# Patient Record
Sex: Female | Born: 1985
Health system: Southern US, Community
[De-identification: ages and names within clinical notes are randomized; demographics above are authoritative.]

## PROBLEM LIST (undated history)

## (undated) DIAGNOSIS — J45909 Unspecified asthma, uncomplicated: Secondary | ICD-10-CM

## (undated) DIAGNOSIS — D649 Anemia, unspecified: Secondary | ICD-10-CM

## (undated) DIAGNOSIS — F419 Anxiety disorder, unspecified: Secondary | ICD-10-CM

---

## 2011-08-26 LAB — HM PAP SMEAR

## 2015-04-26 LAB — HM PAP SMEAR

## 2016-07-10 ENCOUNTER — Encounter: Payer: Self-pay | Admitting: General Practice

## 2016-10-13 ENCOUNTER — Ambulatory Visit (INDEPENDENT_AMBULATORY_CARE_PROVIDER_SITE_OTHER): Payer: PRIVATE HEALTH INSURANCE | Admitting: Family Medicine

## 2016-10-13 ENCOUNTER — Encounter: Payer: Self-pay | Admitting: Family Medicine

## 2016-10-13 VITALS — BP 110/80 | HR 73 | Temp 98.0°F | Resp 16 | Ht 69.0 in | Wt 146.1 lb

## 2016-10-13 DIAGNOSIS — Z0000 Encounter for general adult medical examination without abnormal findings: Secondary | ICD-10-CM | POA: Insufficient documentation

## 2016-10-13 DIAGNOSIS — Z23 Encounter for immunization: Secondary | ICD-10-CM

## 2016-10-13 DIAGNOSIS — Z01419 Encounter for gynecological examination (general) (routine) without abnormal findings: Secondary | ICD-10-CM | POA: Diagnosis not present

## 2016-10-13 DIAGNOSIS — R200 Anesthesia of skin: Secondary | ICD-10-CM | POA: Insufficient documentation

## 2016-10-13 DIAGNOSIS — T85698A Other mechanical complication of other specified internal prosthetic devices, implants and grafts, initial encounter: Secondary | ICD-10-CM | POA: Diagnosis not present

## 2016-10-13 LAB — HEPATIC FUNCTION PANEL
ALBUMIN: 4.5 g/dL (ref 3.5–5.2)
ALK PHOS: 40 U/L (ref 39–117)
ALT: 14 U/L (ref 0–35)
AST: 15 U/L (ref 0–37)
BILIRUBIN TOTAL: 0.4 mg/dL (ref 0.2–1.2)
Bilirubin, Direct: 0.1 mg/dL (ref 0.0–0.3)
Total Protein: 7 g/dL (ref 6.0–8.3)

## 2016-10-13 LAB — CBC WITH DIFFERENTIAL/PLATELET
Basophils Absolute: 0 10*3/uL (ref 0.0–0.1)
Basophils Relative: 0.8 % (ref 0.0–3.0)
EOS PCT: 2 % (ref 0.0–5.0)
Eosinophils Absolute: 0.1 10*3/uL (ref 0.0–0.7)
HCT: 35.8 % — ABNORMAL LOW (ref 36.0–46.0)
Hemoglobin: 12 g/dL (ref 12.0–15.0)
LYMPHS ABS: 1.7 10*3/uL (ref 0.7–4.0)
Lymphocytes Relative: 34.1 % (ref 12.0–46.0)
MCHC: 33.6 g/dL (ref 30.0–36.0)
MCV: 89.4 fl (ref 78.0–100.0)
MONO ABS: 0.7 10*3/uL (ref 0.1–1.0)
MONOS PCT: 13.1 % — AB (ref 3.0–12.0)
NEUTROS ABS: 2.5 10*3/uL (ref 1.4–7.7)
NEUTROS PCT: 50 % (ref 43.0–77.0)
PLATELETS: 264 10*3/uL (ref 150.0–400.0)
RBC: 4 Mil/uL (ref 3.87–5.11)
RDW: 15.1 % (ref 11.5–15.5)
WBC: 5.1 10*3/uL (ref 4.0–10.5)

## 2016-10-13 LAB — BASIC METABOLIC PANEL
BUN: 9 mg/dL (ref 6–23)
CHLORIDE: 107 meq/L (ref 96–112)
CO2: 27 meq/L (ref 19–32)
CREATININE: 0.69 mg/dL (ref 0.40–1.20)
Calcium: 9.3 mg/dL (ref 8.4–10.5)
GFR: 104.17 mL/min (ref >60.00)
Glucose, Bld: 97 mg/dL (ref 70–99)
POTASSIUM: 4 meq/L (ref 3.5–5.1)
Sodium: 140 mEq/L (ref 135–145)

## 2016-10-13 LAB — LIPID PANEL
CHOL/HDL RATIO: 3
Cholesterol: 177 mg/dL (ref 0–200)
HDL: 63.6 mg/dL (ref >39.00)
LDL Cholesterol: 105 mg/dL — ABNORMAL HIGH (ref 0–99)
NonHDL: 113.66
TRIGLYCERIDES: 44 mg/dL (ref 0.0–149.0)
VLDL: 8.8 mg/dL (ref 0.0–40.0)

## 2016-10-13 LAB — TSH: TSH: 1.02 u[IU]/mL (ref 0.35–4.50)

## 2016-10-13 LAB — VITAMIN D 25 HYDROXY (VIT D DEFICIENCY, FRACTURES): VITD: 17.9 ng/mL — AB (ref 30.00–100.00)

## 2016-10-13 NOTE — Progress Notes (Signed)
Pre visit review using our clinic review tool, if applicable. No additional management support is needed unless otherwise documented below in the visit note. 

## 2016-10-13 NOTE — Progress Notes (Signed)
   Subjective:    Patient ID: Sherry Rodriguez, female    DOB: 11/27/1985, 31 y.o.   MRN: [id number]  HPI New to establish.  No recent PCP.    CPE- no current concerns.  UTD on pap, too young mammo, colonoscopy.   Review of Systems Patient reports no vision/ hearing changes, adenopathy,fever, weight change,  persistant/recurrent hoarseness , swallowing issues, chest pain, palpitations, edema, persistant/recurrent cough, hemoptysis, dyspnea (rest/exertional/paroxysmal nocturnal), gastrointestinal bleeding (melena, rectal bleeding), abdominal pain, significant heartburn, bowel changes, GU symptoms (dysuria, hematuria, incontinence), Gyn symptoms (abnormal  bleeding, pain),  syncope, focal weakness, memory loss, numbness & tingling, skin/hair/nail changes, abnormal bruising or bleeding, anxiety, or depression.     Objective:   Physical Exam General Appearance:    Alert, cooperative, no distress, appears stated age  Head:    Normocephalic, without obvious abnormality, atraumatic  Eyes:    PERRL, conjunctiva/corneas clear, EOM's intact, fundi    benign, both eyes  Ears:    Normal TM's and external ear canals, both ears  Nose:   Nares normal, septum midline, mucosa normal, no drainage    or sinus tenderness  Throat:   Lips, mucosa, and tongue normal; teeth and gums normal  Neck:   Supple, symmetrical, trachea midline, no adenopathy;    Thyroid: no enlargement/tenderness/nodules  Back:     Symmetric, no curvature, ROM normal, no CVA tenderness  Lungs:     Clear to auscultation bilaterally, respirations unlabored  Chest Wall:    No tenderness or deformity   Heart:    Regular rate and rhythm, S1 and S2 normal, no murmur, rub   or gallop  Breast Exam:    Deferred to GYN  Abdomen:     Soft, non-tender, bowel sounds active all four quadrants,    no masses, no organomegaly  Genitalia:    Deferred to GYN  Rectal:    Extremities:   Extremities normal, atraumatic, no cyanosis or edema  Pulses:   2+  and symmetric all extremities  Skin:   Skin color, texture, turgor normal, no rashes or lesions  Lymph nodes:   Cervical, supraclavicular, and axillary nodes normal  Neurologic:   CNII-XII intact, normal strength, diminished sensation on L side of face,normal reflexes throughout          Assessment & Plan:

## 2016-10-13 NOTE — Patient Instructions (Signed)
Follow up in 1 year or as needed We'll notify you of your lab results and make any changes if needed We'll call you with your GYN referral and your Oral-Maxillofacial surgery appt Continue to work on healthy diet and regular exercise- you look great!!! Call with any questions or concerns Happy [employer] [place] [employer]uch[employer]irthday!!!

## 2016-10-13 NOTE — Addendum Note (Signed)
Addended by: Geannie RisenBRODMERKEL, JESSICA L on: 10/13/2016 02:25 PM   Modules accepted: Orders

## 2016-10-14 NOTE — Progress Notes (Signed)
Called pt and lmovm to return call.

## 2016-10-15 ENCOUNTER — Other Ambulatory Visit: Payer: Self-pay | Admitting: General Practice

## 2016-10-15 MED ORDER — VITAMIN D (ERGOCALCIFEROL) 1.25 MG (50000 UNIT) PO CAPS: 50000.0000 [IU] | ORAL_CAPSULE | ORAL | 0 refills | Status: DC

## 2016-10-27 LAB — HM PAP SMEAR

## 2016-11-04 ENCOUNTER — Encounter: Payer: Self-pay | Admitting: General Practice

## 2016-12-19 ENCOUNTER — Ambulatory Visit (INDEPENDENT_AMBULATORY_CARE_PROVIDER_SITE_OTHER): Payer: PRIVATE HEALTH INSURANCE | Admitting: Physician Assistant

## 2016-12-19 ENCOUNTER — Encounter: Payer: Self-pay | Admitting: Physician Assistant

## 2016-12-19 VITALS — BP 104/60 | HR 55 | Temp 97.6°F | Resp 14 | Ht 69.0 in | Wt 144.0 lb

## 2016-12-19 DIAGNOSIS — H1031 Unspecified acute conjunctivitis, right eye: Secondary | ICD-10-CM | POA: Insufficient documentation

## 2016-12-19 MED ORDER — POLYMYXIN B-TRIMETHOPRIM 10000-0.1 UNIT/ML-% OP SOLN: 1.0000 [drp] | Freq: Four times a day (QID) | OPHTHALMIC | 0 refills | Status: DC

## 2016-12-19 NOTE — Progress Notes (Signed)
Pre visit review using our clinic review tool, if applicable. No additional management support is needed unless otherwise documented below in the visit note. 

## 2016-12-19 NOTE — Progress Notes (Signed)
Patient presents to clinic today c/o redness of R eye x 1 day. Notes some irritation to eye last night when her son accidentally poked her in her eye. Notes initial discomfort that resolved. Now with eye redness. Denies crusting, increased tearing or drainage. Denies vision changes. Denies fever, chills, malaise.  History reviewed. No pertinent past medical history.  Current Outpatient Prescriptions on File Prior to Visit  Medication Sig Dispense Refill  . Vitamin D, Ergocalciferol, (DRISDOL) 50000 units CAPS capsule Take 1 capsule (50,000 Units total) by mouth every 7 (seven) days. 12 capsule 0   No current facility-administered medications on file prior to visit.     Allergies  Allergen Reactions  . Tamiflu [Oseltamivir Phosphate] Other (See Comments)    Caused sleepwalking    Family History  Problem Relation Age of Onset  . Healthy Mother   . Healthy Father   . Heart failure Paternal Grandmother   . Diabetes Paternal Grandmother     Social History   Social History  . Marital status: Married    Spouse name: N/A  . Number of children: N/A  . Years of education: N/A   Social History Main Topics  . Smoking status: Never Smoker  . Smokeless tobacco: Never Used  . Alcohol use Yes  . Drug use: Yes    Types: Marijuana  . Sexual activity: Not Asked   Other Topics Concern  . None   Social History Narrative  . None    Review of Systems - See HPI.  All other ROS are negative.  BP 104/60   Pulse (!) 55   Temp 97.6 F (36.4 C) (Oral)   Resp 14   Ht  (1.753 m)   Wt 144 lb (65.3 kg)   SpO2 99%   BMI 21.27 kg/m   Physical Exam  Constitutional: She is oriented to person, place, and time and well-developed, well-nourished, and in no distress.  HENT:  Head: Normocephalic and atraumatic.  Eyes: EOM are normal. Pupils are equal, round, and reactive to light. Right eye exhibits no discharge, no exudate and no hordeolum. No foreign body present in the right eye.  Left eye exhibits no discharge, no exudate and no hordeolum. No foreign body present in the left eye. Right conjunctiva is injected. Right conjunctiva has no hemorrhage. Left conjunctiva is not injected. Left conjunctiva has no hemorrhage.  Neck: Neck supple.  Cardiovascular: Normal rate, regular rhythm, normal heart sounds and intact distal pulses.   Pulmonary/Chest: Effort normal and breath sounds normal. No respiratory distress. She has no wheezes. She has no rales. She exhibits no tenderness.  Lymphadenopathy:    She has no cervical adenopathy.  Neurological: She is alert and oriented to person, place, and time.  Skin: Skin is warm and dry. No rash noted.  Psychiatric: Affect normal.  Vitals reviewed.  Recent Results (from the past 2160 hour(s))  Lipid panel     Status: Abnormal   Collection Time: 10/13/16  2:22 PM  Result Value Ref Range   Cholesterol 177 0 - 200 mg/dL    Comment: ATP III Classification       Desirable:  < 200 mg/dL               Borderline High:  200 - 239 mg/dL          High:  > = 409 mg/dL   Triglycerides 81.1 0.0 - 149.0 mg/dL    Comment: Normal:  <914 mg/dLBorderline High:  150 - 199  mg/dL   HDL 16.10 >96.04 mg/dL   VLDL 8.8 0.0 - 54.0 mg/dL   LDL Cholesterol 981 (H) 0 - 99 mg/dL   Total CHOL/HDL Ratio 3     Comment:                Men          Women1/2 Average Risk     3.4          3.3Average Risk          5.0          4.42X Average Risk          9.6          7.13X Average Risk          15.0          11.0                       NonHDL 113.66     Comment: NOTE:  Non-HDL goal should be 30 mg/dL higher than patient's LDL goal (i.e. LDL goal of < 70 mg/dL, would have non-HDL goal of < 100 mg/dL)  Basic metabolic panel     Status: None   Collection Time: 10/13/16  2:22 PM  Result Value Ref Range   Sodium 140 135 - 145 mEq/L   Potassium 4.0 3.5 - 5.1 mEq/L   Chloride 107 96 - 112 mEq/L   CO2 27 19 - 32 mEq/L   Glucose, Bld 97 70 - 99 mg/dL   BUN 9 6 - 23 mg/dL     Creatinine, Ser 1.91 0.40 - 1.20 mg/dL   Calcium 9.3 8.4 - 47.8 mg/dL   GFR 295.62 >13.08 mL/min  TSH     Status: None   Collection Time: 10/13/16  2:22 PM  Result Value Ref Range   TSH 1.02 0.35 - 4.50 uIU/mL  Hepatic function panel     Status: None   Collection Time: 10/13/16  2:22 PM  Result Value Ref Range   Total Bilirubin 0.4 0.2 - 1.2 mg/dL   Bilirubin, Direct 0.1 0.0 - 0.3 mg/dL   Alkaline Phosphatase 40 39 - 117 U/L   AST 15 0 - 37 U/L   ALT 14 0 - 35 U/L   Total Protein 7.0 6.0 - 8.3 g/dL   Albumin 4.5 3.5 - 5.2 g/dL  VITAMIN D 25 Hydroxy (Vit-D Deficiency, Fractures)     Status: Abnormal   Collection Time: 10/13/16  2:22 PM  Result Value Ref Range   VITD 17.90 (L) 30.00 - 100.00 ng/mL  CBC with Differential/Platelet     Status: Abnormal   Collection Time: 10/13/16  2:22 PM  Result Value Ref Range   WBC 5.1 4.0 - 10.5 K/uL   RBC 4.00 3.87 - 5.11 Mil/uL   Hemoglobin 12.0 12.0 - 15.0 g/dL   HCT 65.7 (L) 84.6 - 96.2 %   MCV 89.4 78.0 - 100.0 fl   MCHC 33.6 30.0 - 36.0 g/dL   RDW 95.2 84.1 - 32.4 %   Platelets 264.0 150.0 - 400.0 K/uL   Neutrophils Relative % 50.0 43.0 - 77.0 %   Lymphocytes Relative 34.1 12.0 - 46.0 %   Monocytes Relative 13.1 (H) 3.0 - 12.0 %   Eosinophils Relative 2.0 0.0 - 5.0 %   Basophils Relative 0.8 0.0 - 3.0 %   Neutro Abs 2.5 1.4 - 7.7 K/uL   Lymphs Abs 1.7 0.7 - 4.0 K/uL  Monocytes Absolute 0.7 0.1 - 1.0 K/uL   Eosinophils Absolute 0.1 0.0 - 0.7 K/uL   Basophils Absolute 0.0 0.0 - 0.1 K/uL  HM PAP SMEAR     Status: None   Collection Time: 10/27/16 12:00 AM  Result Value Ref Range   HM Pap smear completed    Assessment/Plan: Acute conjunctivitis of right eye Irritant versus start of bacterial. Supportive measures and OTC medications reviewed. ABX given for patient to use as directed if symptoms are not improving overnight.     Piedad Climes, PA-C

## 2016-12-19 NOTE — Assessment & Plan Note (Signed)
Irritant versus start of bacterial. Supportive measures and OTC medications reviewed. ABX given for patient to use as directed if symptoms are not improving overnight.

## 2016-12-19 NOTE — Patient Instructions (Signed)
Please avoid touching the eye.  Can use warm compresses to promote any drainage. Also to help soothe the eye.  Again this can be just from irritation. If not improving overnight, please start the antibiotic I have given you and take as directed. Follow-up if not resolving.

## 2017-01-05 ENCOUNTER — Other Ambulatory Visit: Payer: Self-pay | Admitting: Family Medicine

## 2017-03-02 ENCOUNTER — Encounter: Payer: Self-pay | Admitting: Physician Assistant

## 2017-03-02 ENCOUNTER — Ambulatory Visit (INDEPENDENT_AMBULATORY_CARE_PROVIDER_SITE_OTHER): Payer: PRIVATE HEALTH INSURANCE | Admitting: Physician Assistant

## 2017-03-02 ENCOUNTER — Other Ambulatory Visit (HOSPITAL_COMMUNITY)
Admission: RE | Admit: 2017-03-02 | Discharge: 2017-03-02 | Disposition: A | Discharge status: Home / Self Care | Payer: PRIVATE HEALTH INSURANCE | Source: Ambulatory Visit | Attending: Physician Assistant | Admitting: Physician Assistant

## 2017-03-02 VITALS — BP 108/68 | HR 52 | Temp 98.2°F | Resp 14 | Ht 69.0 in | Wt 146.0 lb

## 2017-03-02 DIAGNOSIS — R8290 Unspecified abnormal findings in urine: Secondary | ICD-10-CM

## 2017-03-02 LAB — POCT URINALYSIS DIPSTICK
BILIRUBIN UA: NEGATIVE
Glucose, UA: NEGATIVE
Ketones, UA: NEGATIVE
LEUKOCYTES UA: NEGATIVE
NITRITE UA: NEGATIVE
PH UA: 6 (ref 5.0–8.0)
PROTEIN UA: NEGATIVE
Spec Grav, UA: 1.02 (ref 1.010–1.025)
UROBILINOGEN UA: 0.2 U/dL

## 2017-03-02 NOTE — Addendum Note (Signed)
Addended by: Con MemosMOORE, Kennedie Pardoe S on: 03/02/2017 01:34 PM   Modules accepted: Orders

## 2017-03-02 NOTE — Patient Instructions (Signed)
Please stay well-hydrated. Start a daily probiotic. We are doing further testing on your urine. I will call with results and we will alter regimen based on results.   Follow-up if new or worsening symptoms occur.

## 2017-03-02 NOTE — Progress Notes (Signed)
   Patient presents to clinic today x 3 weeks of foul-smelling urine. Denies dysuria, low back pain or suprapubic pain. Denies urinary frequency, urgency. Denies fever, chills, nausea or vomiting. Denies vaginal symptoms. Is currently on menstrual period. Does endorses significant past history of UTI.   History reviewed. No pertinent past medical history.  No current outpatient prescriptions on file prior to visit.   No current facility-administered medications on file prior to visit.     Allergies  Allergen Reactions  . Tamiflu [Oseltamivir Phosphate] Other (See Comments)    Caused sleepwalking    Family History  Problem Relation Age of Onset  . Healthy Mother   . Healthy Father   . Heart failure Paternal Grandmother   . Diabetes Paternal Grandmother     Social History   Social History  . Marital status: Married    Spouse name: N/A  . Number of children: N/A  . Years of education: N/A   Social History Main Topics  . Smoking status: Never Smoker  . Smokeless tobacco: Never Used  . Alcohol use Yes  . Drug use: Yes    Types: Marijuana  . Sexual activity: Not Asked   Other Topics Concern  . None   Social History Narrative  . None   Review of Systems - See HPI.  All other ROS are negative.  BP 108/68   Pulse (!) 52   Temp 98.2 F (36.8 C) (Oral)   Resp 14   Ht 5\' 9"  (1.753 m)   Wt 146 lb (66.2 kg)   SpO2 99%   BMI 21.56 kg/m   Physical Exam  Constitutional: She is oriented to person, place, and time and well-developed, well-nourished, and in no distress.  HENT:  Head: Normocephalic and atraumatic.  Eyes: Conjunctivae are normal.  Cardiovascular: Normal rate, regular rhythm, normal heart sounds and intact distal pulses.   Pulmonary/Chest: Effort normal and breath sounds normal. No respiratory distress. She has no wheezes. She has no rales. She exhibits no tenderness.  Abdominal: Soft. Bowel sounds are normal. She exhibits no distension and no mass. There  is no tenderness. There is no rebound and no guarding.  Negative CVA tenderness.  Neurological: She is alert and oriented to person, place, and time.  Skin: Skin is warm and dry. No rash noted.  Psychiatric: Affect normal.  Vitals reviewed.   Assessment/Plan: 1. Abnormal urine odor Urine dip + blood - patient on menstrual period currently. Urine dip negative for sign on infection. Will send for culture and urine ancillary testing for BV, yeast, etc. Start probiotic daily. Continue hydration. Will treat based on lab results.  - POCT Urinalysis Dipstick   Piedad ClimesMartin, Malikiah Debarr Cody, PA-C

## 2017-03-02 NOTE — Progress Notes (Signed)
Pre visit review using our clinic review tool, if applicable. No additional management support is needed unless otherwise documented below in the visit note. 

## 2017-03-04 ENCOUNTER — Other Ambulatory Visit: Payer: Self-pay | Admitting: Physician Assistant

## 2017-03-04 DIAGNOSIS — N390 Urinary tract infection, site not specified: Secondary | ICD-10-CM

## 2017-03-04 LAB — URINE CULTURE

## 2017-03-04 MED ORDER — CEPHALEXIN 500 MG PO CAPS: 500.0000 mg | ORAL_CAPSULE | Freq: Two times a day (BID) | ORAL | 0 refills | Status: AC

## 2017-03-05 LAB — URINE CYTOLOGY ANCILLARY ONLY
Bacterial vaginitis: NEGATIVE
Candida vaginitis: NEGATIVE

## 2017-06-09 ENCOUNTER — Encounter: Payer: Self-pay | Admitting: Physician Assistant

## 2017-06-09 ENCOUNTER — Ambulatory Visit (INDEPENDENT_AMBULATORY_CARE_PROVIDER_SITE_OTHER): Payer: 59 | Admitting: Physician Assistant

## 2017-06-09 VITALS — BP 102/70 | HR 62 | Temp 98.4°F | Resp 14 | Ht 69.0 in | Wt 144.0 lb

## 2017-06-09 DIAGNOSIS — R399 Unspecified symptoms and signs involving the genitourinary system: Secondary | ICD-10-CM | POA: Diagnosis not present

## 2017-06-09 LAB — POCT URINALYSIS DIPSTICK
BILIRUBIN UA: NEGATIVE
Glucose, UA: NEGATIVE
Ketones, UA: NEGATIVE
NITRITE UA: NEGATIVE
PH UA: 7.5 (ref 5.0–8.0)
Spec Grav, UA: <=1.005 — AB (ref 1.010–1.025)
UROBILINOGEN UA: 0.2 U/dL

## 2017-06-09 LAB — URINALYSIS, MICROSCOPIC ONLY

## 2017-06-09 MED ORDER — CIPROFLOXACIN HCL 500 MG PO TABS: 500.0000 mg | ORAL_TABLET | Freq: Two times a day (BID) | ORAL | 0 refills | Status: DC

## 2017-06-09 NOTE — Progress Notes (Signed)
Pre visit review using our clinic review tool, if applicable. No additional management support is needed unless otherwise documented below in the visit note. 

## 2017-06-09 NOTE — Progress Notes (Signed)
Patient presents to clinic today c/o 3 days of urinary urgency, dysuria. Patient with history of UTI stating they feel similar. Has noted significant blood in the urine. Denies fever, chills, vomiting. Endorses some lower abdominal cramping. 6 more days. Denies vaginal pain, itch or discharge. Denies dyspareunia.  Has tried AZO Sunday with little relief. Has been hydrating well.    History reviewed. No pertinent past medical history.  No current outpatient prescriptions on file prior to visit.   No current facility-administered medications on file prior to visit.     Allergies  Allergen Reactions  . Tamiflu [Oseltamivir Phosphate] Other (See Comments)    Caused sleepwalking    Family History  Problem Relation Age of Onset  . Healthy Mother   . Healthy Father   . Heart failure Paternal Grandmother   . Diabetes Paternal Grandmother     Social History   Social History  . Marital status: Married    Spouse name: N/A  . Number of children: N/A  . Years of education: N/A   Social History Main Topics  . Smoking status: Never Smoker  . Smokeless tobacco: Never Used  . Alcohol use Yes  . Drug use: Yes    Types: Marijuana  . Sexual activity: Not Asked   Other Topics Concern  . None   Social History Narrative  . None   Review of Systems - See HPI.  All other ROS are negative.  BP 102/70   Pulse 62   Temp 98.4 F (36.9 C) (Oral)   Resp 14   Ht  (1.753 m)   Wt 144 lb (65.3 kg)   SpO2 98%   BMI 21.27 kg/m   Physical Exam  Constitutional: She is oriented to person, place, and time and well-developed, well-nourished, and in no distress.  HENT:  Head: Normocephalic and atraumatic.  Eyes: Conjunctivae are normal.  Neck: Neck supple.  Cardiovascular: Normal rate, regular rhythm, normal heart sounds and intact distal pulses.   Pulmonary/Chest: Effort normal and breath sounds normal. No respiratory distress. She has no wheezes. She has no rales. She exhibits no  tenderness.  Abdominal: Soft. Bowel sounds are normal. She exhibits no distension and no mass. There is no tenderness. There is no rebound, no guarding and no CVA tenderness.  Neurological: She is alert and oriented to person, place, and time.  Skin: Skin is warm and dry.  Vitals reviewed.  Recent Results (from the past 2160 hour(s))  POCT Urinalysis Dipstick     Status: Abnormal   Collection Time: 06/09/17  9:12 AM  Result Value Ref Range   Color, UA amber    Clarity, UA cloudy    Glucose, UA negative    Bilirubin, UA negative    Ketones, UA negative    Spec Grav, UA <=1.005 (A) 1.010 - 1.025   Blood, UA 3+    pH, UA 7.5 5.0 - 8.0   Protein, UA 1+    Urobilinogen, UA 0.2 0.2 or 1.0 E.U./dL   Nitrite, UA negative    Leukocytes, UA Large (3+) (A) Negative    Assessment/Plan: 1. UTI symptoms Urine dip + LE and blood. No alarm signs/symptoms. Husband with vasectomy. Due for menstrual period in the next couple of days. Denues any concern for pregnancy. + history of UTI. Start Cipro. Will check culture and urine microalbumin. Supportive measures reviewed. - POCT Urinalysis Dipstick - Urine Culture - ciprofloxacin (CIPRO) 500 MG tablet; Take 1 tablet (500 mg total) by mouth 2 (  two) times daily.  Dispense: 10 tablet; Refill: 0   Piedad Climes, [place]

## 2017-06-09 NOTE — Patient Instructions (Signed)
Your symptoms are consistent with a bladder infection, also called acute cystitis. Please take your antibiotic (Cipro) as directed until all pills are gone.  Stay very well hydrated.  Consider a daily probiotic (Align, Culturelle, or Activia) to help prevent stomach upset caused by the antibiotic.  Taking a probiotic daily may also help prevent recurrent UTIs.  Also consider taking AZO (Phenazopyridine) tablets to help decrease pain with urination.  I will call you with your urine testing results.  We will change antibiotics if indicated.  Call or return to clinic if symptoms are not resolved by completion of antibiotic.   Urinary Tract Infection A urinary tract infection (UTI) can occur any place along the urinary tract. The tract includes the kidneys, ureters, bladder, and urethra. A type of germ called bacteria often causes a UTI. UTIs are often helped with antibiotic medicine.  HOME CARE   If given, take antibiotics as told by your doctor. Finish them even if you start to feel better.  Drink enough fluids to keep your pee (urine) clear or pale yellow.  Avoid tea, drinks with caffeine, and bubbly (carbonated) drinks.  Pee often. Avoid holding your pee in for a long time.  Pee before and after having sex (intercourse).  Wipe from front to back after you poop (bowel movement) if you are a woman. Use each tissue only once. GET HELP RIGHT AWAY IF:   You have back pain.  You have lower belly (abdominal) pain.  You have chills.  You feel sick to your stomach (nauseous).  You throw up (vomit).  Your burning or discomfort with peeing does not go away.  You have a fever.  Your symptoms are not better in 3 days. MAKE SURE YOU:   Understand these instructions.  Will watch your condition.  Will get help right away if you are not doing well or get worse. Document Released: 01/28/2008 Document Revised: 05/05/2012 Document Reviewed: 03/11/2012 Exit[employer] Patient Information 2015  [employer]. This information is not intended to replace advice given to you by your health care provider. Make sure you discuss any questions you have with your health care provider.    

## 2017-06-11 LAB — URINE CULTURE
MICRO NUMBER: 81157443
SPECIMEN QUALITY: ADEQUATE

## 2017-10-06 ENCOUNTER — Encounter: Payer: Self-pay | Admitting: Physician Assistant

## 2017-10-06 ENCOUNTER — Other Ambulatory Visit: Payer: Self-pay

## 2017-10-06 ENCOUNTER — Ambulatory Visit (INDEPENDENT_AMBULATORY_CARE_PROVIDER_SITE_OTHER): Payer: 59 | Admitting: Physician Assistant

## 2017-10-06 VITALS — BP 118/78 | HR 51 | Temp 97.8°F | Resp 14 | Ht 69.0 in | Wt 147.0 lb

## 2017-10-06 DIAGNOSIS — R0602 Shortness of breath: Secondary | ICD-10-CM | POA: Diagnosis not present

## 2017-10-06 MED ORDER — ALBUTEROL SULFATE HFA 108 (90 BASE) MCG/ACT IN AERS
2.0000 | INHALATION_SPRAY | Freq: Four times a day (QID) | RESPIRATORY_TRACT | 0 refills | Status: DC | PRN
Start: 2017-10-06 — End: 2017-11-02

## 2017-10-06 NOTE — Progress Notes (Signed)
   Patient presents to clinic today c/o noted SOB with significant exertion. Is noting while exercising. Denies any chest pain, racing heart, lightheadedness or dizziness. Notes history of reactive airway when exercising as a child. Was never diagnosed or treated. Has recently gotten into [employer] and noticing these symptoms. Denies chronic cough. Denies family history of HOCM or sudden syncope.   History reviewed. No pertinent past medical history.  No current outpatient medications on file prior to visit.   No current facility-administered medications on file prior to visit.     Allergies  Allergen Reactions  . Tamiflu [Oseltamivir Phosphate] Other (See Comments)    Caused sleepwalking    Family History  Problem Relation Age of Onset  . Healthy Mother   . Healthy Father   . Heart failure Paternal Grandmother   . Diabetes Paternal Grandmother     Social History   Socioeconomic History  . Marital status: Married    Spouse name: None  . Number of children: None  . Years of education: None  . Highest education level: None  Social Needs  . Financial resource strain: None  . Food insecurity - worry: None  . Food insecurity - inability: None  . Transportation needs - medical: None  . Transportation needs - non-medical: None  Occupational History  . None  Tobacco Use  . Smoking status: Never Smoker  . Smokeless tobacco: Never Used  Substance and Sexual Activity  . Alcohol use: Yes  . Drug use: Yes    Types: Marijuana  . Sexual activity: None  Other Topics Concern  . None  Social History Narrative  . None   Review of Systems - See HPI.  All other ROS are negative.  BP 118/78   Pulse (!) 51   Temp 97.8 F (36.6 C) (Oral)   Resp 14   Ht 5\' 9"  (1.753 m)   Wt 147 lb (66.7 kg)   SpO2 98%   BMI 21.71 kg/m   Physical Exam  Constitutional: She is oriented to person, place, and time and well-developed, well-nourished, and in no distress.  HENT:  Head:  Normocephalic and atraumatic.  Eyes: Conjunctivae are normal.  Neck: Neck supple.  Cardiovascular: Normal rate, regular rhythm, normal heart sounds and intact distal pulses.  Pulmonary/Chest: Effort normal and breath sounds normal. No respiratory distress. She has no wheezes. She has no rales. She exhibits no tenderness.  Neurological: She is alert and oriented to person, place, and time.  Skin: Skin is warm and dry. No rash noted.  Psychiatric: Affect normal.  Vitals reviewed.  Assessment/Plan: 1. SOBOE (shortness of breath on exertion) Exam unremarkable. Suspect exercise-induced. Will give SABA to use prior to training/exercise to see if this helps. Referral to Pulmonology placed for PFTs.  - albuterol (PROVENTIL HFA;VENTOLIN HFA) 108 (90 Base) MCG/ACT inhaler; Inhale 2 puffs into the lungs every 6 (six) hours as needed for wheezing or shortness of breath.  Dispense: 1 Inhaler; Refill: 0 - Ambulatory referral to Pulmonology   Piedad ClimesWilliam Cody Arnelle Nale, PA-C

## 2017-10-06 NOTE — Patient Instructions (Signed)
I am setting you up for lung function testing.  Use the albuterol 30 minutes prior to exercise to see how this helps.    How to Use a Metered Dose Inhaler A metered dose inhaler is a handheld device for taking medicine that must be breathed into the lungs (inhaled). The device can be used to deliver a variety of inhaled medicines, including:  Quick relief or rescue medicines, such as bronchodilators.  Controller medicines, such as corticosteroids.  The medicine is delivered by pushing down on a metal canister to release a preset amount of spray and medicine. Each device contains the amount of medicine that is needed for a preset number of uses (inhalations). Your health care provider may recommend that you use a spacer with your inhaler to help you take the medicine more effectively. A spacer is a plastic tube with a mouthpiece on one end and an opening that connects to the inhaler on the other end. A spacer holds the medicine in a tube for a short time, which allows you to inhale more medicine. What are the risks? If you do not use your inhaler correctly, medicine might not reach your lungs to help you breathe. Inhaler medicine can cause side effects, such as:  Mouth or throat infection.  Cough.  Hoarseness.  Headache.  Nausea and vomiting.  Lung infection (pneumonia) in people who have a lung condition called COPD.  How to use a metered dose inhaler without a spacer 1. Remove the cap from the inhaler. 2. If you are using the inhaler for the first time, shake it for 5 seconds, turn it away from your face, then release 4 puffs into the air. This is called priming. 3. Shake the inhaler for 5 seconds. 4. Position the inhaler so the top of the canister faces up. 5. Put your index finger on the top of the medicine canister. Support the bottom of the inhaler with your thumb. 6. Breathe out normally and as completely as possible, away from the inhaler. 7. Either place the inhaler  between your teeth and close your lips tightly around the mouthpiece, or hold the inhaler 1-2 inches (2.5-5 cm) away from your open mouth. Keep your tongue down out of the way. If you are unsure which technique to use, ask your health care provider. 8. Press the canister down with your index finger to release the medicine, then inhale deeply and slowly through your mouth (not your nose) until your lungs are completely filled. Inhaling should take 4-6 seconds. 9. Hold the medicine in your lungs for 5-10 seconds (10 seconds is best). This helps the medicine get into the small airways of your lungs. 10. With your lips in a tight circle (pursed), breathe out slowly. 11. Repeat steps 3-10 until you have taken the number of puffs that your health care provider directed. Wait about 1 minute between puffs or as directed. 12. Put the cap on the inhaler. 13. If you are using a steroid inhaler, rinse your mouth with water, gargle, and spit out the water. Do not swallow the water. How to use a metered dose inhaler with a spacer 1. Remove the cap from the inhaler. 2. If you are using the inhaler for the first time, shake it for 5 seconds, turn it away from your face, then release 4 puffs into the air. This is called priming. 3. Shake the inhaler for 5 seconds. 4. Place the open end of the spacer onto the inhaler mouthpiece. 5. Position the inhaler  so the top of the canister faces up and the spacer mouthpiece faces you. 6. Put your index finger on the top of the medicine canister. Support the bottom of the inhaler and the spacer with your thumb. 7. Breathe out normally and as completely as possible, away from the spacer. 8. Place the spacer between your teeth and close your lips tightly around it. Keep your tongue down out of the way. 9. Press the canister down with your index finger to release the medicine, then inhale deeply and slowly through your mouth (not your nose) until your lungs are completely filled.  Inhaling should take 4-6 seconds. 10. Hold the medicine in your lungs for 5-10 seconds (10 seconds is best). This helps the medicine get into the small airways of your lungs. 11. With your lips in a tight circle (pursed), breathe out slowly. 12. Repeat steps 3-11 until you have taken the number of puffs that your health care provider directed. Wait about 1 minute between puffs or as directed. 13. Remove the spacer from the inhaler and put the cap on the inhaler. 14. If you are using a steroid inhaler, rinse your mouth with water, gargle, and spit out the water. Do not swallow the water. Follow these instructions at home:  Take your inhaled medicine only as told by your health care provider. Do not use the inhaler more than directed by your health care provider.  Keep all follow-up visits as told by your health care provider. This is important.  If your inhaler has a counter, you can check it to determine how full your inhaler is. If your inhaler does not have a counter, ask your health care provider when you will need to refill your inhaler and write the refill date on a calendar or on your inhaler canister. Note that you cannot know when an inhaler is empty by shaking it.  Follow directions on the package insert for care and cleaning of your inhaler and spacer. Contact a health care provider if:  Symptoms are only partially relieved with your inhaler.  You are having trouble using your inhaler.  You have an increase in phlegm.  You have headaches. Get help right away if:  You feel little or no relief after using your inhaler.  You have dizziness.  You have a fast heart rate.  You have chills or a fever.  You have night sweats.  There is blood in your phlegm. Summary  A metered dose inhaler is a handheld device for taking medicine that must be breathed into the lungs (inhaled).  The medicine is delivered by pushing down on a metal canister to release a preset amount of spray  and medicine.  Each device contains the amount of medicine that is needed for a preset number of uses (inhalations). This information is not intended to replace advice given to you by your health care provider. Make sure you discuss any questions you have with your health care provider. Document Released: 08/11/2005 Document Revised: 07/01/2016 Document Reviewed: 07/01/2016 Elsevier Interactive Patient Education  2017 [employer].

## 2017-10-14 ENCOUNTER — Encounter: Payer: PRIVATE HEALTH INSURANCE | Admitting: Family Medicine

## 2017-11-02 ENCOUNTER — Other Ambulatory Visit: Payer: Self-pay | Admitting: Physician Assistant

## 2017-11-02 DIAGNOSIS — R0602 Shortness of breath: Secondary | ICD-10-CM

## 2018-01-25 ENCOUNTER — Institutional Professional Consult (permissible substitution): Payer: 59 | Admitting: Internal Medicine

## 2019-09-12 ENCOUNTER — Ambulatory Visit (INDEPENDENT_AMBULATORY_CARE_PROVIDER_SITE_OTHER): Payer: 59 | Admitting: Physician Assistant

## 2019-09-12 ENCOUNTER — Other Ambulatory Visit (HOSPITAL_COMMUNITY)
Admission: RE | Admit: 2019-09-12 | Discharge: 2019-09-12 | Disposition: A | Discharge status: Home / Self Care | Payer: 59 | Source: Ambulatory Visit | Attending: Physician Assistant | Admitting: Physician Assistant

## 2019-09-12 ENCOUNTER — Other Ambulatory Visit: Payer: Self-pay

## 2019-09-12 ENCOUNTER — Encounter: Payer: Self-pay | Admitting: Physician Assistant

## 2019-09-12 DIAGNOSIS — R8290 Unspecified abnormal findings in urine: Secondary | ICD-10-CM

## 2019-09-12 DIAGNOSIS — F411 Generalized anxiety disorder: Secondary | ICD-10-CM | POA: Diagnosis not present

## 2019-09-12 MED ORDER — CITALOPRAM HYDROBROMIDE 10 MG PO TABS: 10.0000 mg | ORAL_TABLET | Freq: Every day | ORAL | 1 refills | Status: DC

## 2019-09-12 MED ORDER — HYDROXYZINE HCL 25 MG PO TABS
25.0000 mg | ORAL_TABLET | Freq: Three times a day (TID) | ORAL | 0 refills | Status: DC | PRN
Start: 2019-09-12 — End: 2019-09-15

## 2019-09-12 NOTE — Progress Notes (Signed)
Virtual Visit via Video   I connected with patient on 09/12/19 at  2:30 PM EST by a video enabled telemedicine application and verified that I am speaking with the correct person using two identifiers.  Location patient: Home Location provider: [place] [facility], Office Persons participating in the virtual visit: Patient, Provider, CMA Wyonia Hough)  I discussed the limitations of evaluation and management by telemedicine and the availability of in person appointments. The patient expressed understanding and agreed to proceed.  Subjective:   HPI:   Patient presents via doxy.need today to discuss symptoms of potential anxiety.  Patient endorses increase in worrying associated with some depressed mood and anhedonia over the past couple months, worsening after the death of her dog..  Notes symptoms are worse early in the morning when she wakes up.  Notes she feels shaky/jittery with sweaty palms and chest tightness.  Also notes significant increase in bowel frequency in the morning with some abdominal cramping.  Symptoms improved throughout the day, especially abdominal symptoms.  Does feel anxious and down in the dumps most days of the week.  Denies suicidal thought or ideation.  In relation to GI symptoms patient denies melena, hematochezia or tenesmus.  Denies nausea or vomiting.  Denies heartburn.  Denies change in weight.  Patient does note some difficulty with sleep onset.  Denies prior history of anxiety.  Notes she is just had a lot of stressors recently.  Is currently seeing a marriage Veterinary surgeon.  Patient also endorses foul odor to her menstrual.  This past month.  States.  Was a couple days early, otherwise remarkable.  Denies any ongoing symptoms.  Denies urinary urgency, frequency or dysuria.  Denies vaginal pressure, vaginal pain or discharge.  Is wondering if she can have her urine checked for reassurance.  ROS:   See pertinent positives and negatives per HPI.  Patient  Active Problem List   Diagnosis Date Noted  . Physical exam 10/13/2016  . Left facial numbness 10/13/2016  . 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 Smoking status: Never Smoker  . Smokeless tobacco: Never Used  Substance Use Topics  . Alcohol use: Yes    Current Outpatient Medications:  .  albuterol (PROVENTIL HFA;VENTOLIN HFA) 108 (90 Base) MCG/ACT inhaler, TAKE 2 PUFFS BY MOUTH EVERY 6 HOURS AS NEEDED FOR WHEEZE OR SHORTNESS OF BREATH (Patient not taking: Reported on 09/12/2019), Disp: 18 g, Rfl: 3  Allergies  Allergen Reactions  . Tamiflu [Oseltamivir Phosphate] Other (See Comments)    Caused sleepwalking    Objective:   There were no vitals taken for this visit.  Patient is well-developed, well-nourished in no acute distress.  Resting comfortably at home.  Head is normocephalic, atraumatic.  No labored breathing.  Speech is clear and coherent with logical content.  Patient is alert and oriented at baseline.   Assessment and Plan:   1. Anxiety state Suspect generalized anxiety with more episodes of acute anxiety that are situational.  Some associated depressed mood and anhedonia.  Treatment options discussed with patient.  She does have the headspace app on her phone and will continue utilizing this to help with anxiety.  Mindfulness workbook is also recommended.  Patient is currently seeing a marriage counselor but will consider seeing a personal counselor as well.  Will start hydroxyzine 25 mg 3 times daily as needed for more acute anxiety.  Wanting to avoid benzodiazepines.  Also start citalopram 10 mg  once daily.  Follow-up in 3 to 4 weeks for reassessment.  She can follow-up with myself or her PCP. - hydrOXYzine (ATARAX/VISTARIL) 25 MG tablet; Take 1 tablet (25 mg total) by mouth 3 (three) times daily as needed.  Dispense: 30 tablet; Refill: 0 - citalopram (CELEXA) 10 MG tablet; Take 1 tablet (10 mg total) by  mouth daily.  Dispense: 30 tablet; Refill: 1  2. Urine/Menstrual abnormality Will obtain UA and urine ancillary testing for BV and yeast.  Patient without ongoing symptoms.  Hopefully negative testing will offer reassurance.  Will treat any abnormal findings. - Urinalysis, Routine w reflex microscopic - Urine cytology ancillary only([place]) .   Leeanne Rio, PA-C 09/12/2019

## 2019-09-13 LAB — URINALYSIS, ROUTINE W REFLEX MICROSCOPIC
Bilirubin Urine: NEGATIVE
Ketones, ur: 15 — AB
Leukocytes,Ua: NEGATIVE
Nitrite: NEGATIVE
Specific Gravity, Urine: 1.02 (ref 1.000–1.030)
Total Protein, Urine: NEGATIVE
Urine Glucose: NEGATIVE
Urobilinogen, UA: 0.2 (ref 0.0–1.0)
pH: 6 (ref 5.0–8.0)

## 2019-09-14 ENCOUNTER — Telehealth: Payer: Self-pay | Admitting: Family Medicine

## 2019-09-14 NOTE — Telephone Encounter (Signed)
Please advise of dosage of Hydroxyzine

## 2019-09-14 NOTE — Telephone Encounter (Signed)
Pt concerned that the Atarax is not working. Taking 1 does not ease the anxiety and taking 2 makes her feel over medicated without helping the anxiety.

## 2019-09-15 ENCOUNTER — Telehealth: Payer: Self-pay

## 2019-09-15 MED ORDER — ALPRAZOLAM 0.25 MG PO TABS: 0.2500 mg | ORAL_TABLET | Freq: Two times a day (BID) | ORAL | 0 refills | Status: DC | PRN

## 2019-09-15 NOTE — Telephone Encounter (Signed)
Duplicate phone notes for patient today. Xanax sent to pharmacy to replace Hydroxyzine. See other note for further details.

## 2019-09-15 NOTE — Telephone Encounter (Signed)
We may need to switch from the hydroxyzine to a low dose Xanax 0.25 mg for more acute anxiety. Please see if she is willing and I will send in Rx. Is she taking the SSRI daily as directed? This will take a few weeks to fully get into her system.

## 2019-09-15 NOTE — Telephone Encounter (Signed)
Patient also states she has started the Citalopram. She is aware the Xanax is as needed.

## 2019-09-15 NOTE — Telephone Encounter (Signed)
Patient called in stating that she reviewed her urinalysis results and is concerned with the level of ketones in her urine. Has been researching online and all of her symptoms correlate with hypoglycemia. Wants to know what Cody's thoughts are on this. Please advise

## 2019-09-15 NOTE — Telephone Encounter (Signed)
I think reasonable to check a BMP to see where glucose levels lie. Would like to check repeat UA with her well-hydrated and non-fasting to see if there is any residual ketonuria. I do think that if there is some hypoglycemia present there is definitely still a significant amount of anxiety and depressed mood that we should continue to treat.   Can we get her in this week or Monday AM for labs/repeat UA?

## 2019-09-15 NOTE — Telephone Encounter (Signed)
Left a detailed message on patient VM of Sherry Rodriguez, recommendations. When patient call back needs to know if she is agreeable with switching the Hydroxyzine to Xanax and we will send to the pharmacy for her. Has she started the Citalopram daily? This medication will take a few weeks to fully get into her system.

## 2019-09-15 NOTE — Telephone Encounter (Signed)
Pt agree to start Xanax

## 2019-09-15 NOTE — Progress Notes (Signed)
See phone note.   I know she had completed her visible menstrual period but likely residual microscopic blood and mucous from that. Addressed via phone note.

## 2019-09-15 NOTE — Telephone Encounter (Signed)
Patient has been scheduled for labs tomorrow at 1:30 PM

## 2019-09-15 NOTE — Addendum Note (Signed)
Addended by: Waldon Merl on: 09/15/2019 01:23 PM   Modules accepted: Orders

## 2019-09-16 ENCOUNTER — Other Ambulatory Visit (INDEPENDENT_AMBULATORY_CARE_PROVIDER_SITE_OTHER): Payer: 59

## 2019-09-16 ENCOUNTER — Encounter: Payer: Self-pay | Admitting: Physician Assistant

## 2019-09-16 ENCOUNTER — Ambulatory Visit (INDEPENDENT_AMBULATORY_CARE_PROVIDER_SITE_OTHER): Payer: 59

## 2019-09-16 ENCOUNTER — Other Ambulatory Visit: Payer: Self-pay

## 2019-09-16 ENCOUNTER — Other Ambulatory Visit: Payer: Self-pay | Admitting: Physician Assistant

## 2019-09-16 DIAGNOSIS — R824 Acetonuria: Secondary | ICD-10-CM

## 2019-09-16 DIAGNOSIS — R7309 Other abnormal glucose: Secondary | ICD-10-CM

## 2019-09-16 LAB — URINALYSIS, ROUTINE W REFLEX MICROSCOPIC
Bilirubin Urine: NEGATIVE
Hgb urine dipstick: NEGATIVE
Ketones, ur: NEGATIVE
Leukocytes,Ua: NEGATIVE
Nitrite: NEGATIVE
RBC / HPF: NONE SEEN (ref 0–?)
Specific Gravity, Urine: 1.02 (ref 1.000–1.030)
Total Protein, Urine: NEGATIVE
Urine Glucose: NEGATIVE
Urobilinogen, UA: 0.2 (ref 0.0–1.0)
WBC, UA: NONE SEEN (ref 0–?)
pH: 6.5 (ref 5.0–8.0)

## 2019-09-16 LAB — BASIC METABOLIC PANEL
BUN: 7 mg/dL (ref 6–23)
CO2: 24 mEq/L (ref 19–32)
Calcium: 10.6 mg/dL — ABNORMAL HIGH (ref 8.4–10.5)
Chloride: 103 mEq/L (ref 96–112)
Creatinine, Ser: 0.77 mg/dL (ref 0.40–1.20)
GFR: 84.87 mL/min (ref >60.00)
Glucose, Bld: 131 mg/dL — ABNORMAL HIGH (ref 70–99)
Potassium: 3.6 mEq/L (ref 3.5–5.1)
Sodium: 142 mEq/L (ref 135–145)

## 2019-09-16 LAB — HEMOGLOBIN A1C: Hgb A1c MFr Bld: 5.7 % (ref 4.6–6.5)

## 2019-09-19 ENCOUNTER — Other Ambulatory Visit: Payer: Self-pay

## 2019-09-19 LAB — URINE CYTOLOGY ANCILLARY ONLY
Bacterial Vaginitis-Urine: POSITIVE — AB
Candida Urine: NEGATIVE

## 2019-09-19 MED ORDER — METRONIDAZOLE 500 MG PO TABS: 500.0000 mg | ORAL_TABLET | Freq: Three times a day (TID) | ORAL | 0 refills | Status: DC

## 2019-09-19 MED ORDER — METRONIDAZOLE 500 MG PO TABS: ORAL_TABLET | ORAL | 0 refills | Status: DC

## 2019-10-04 ENCOUNTER — Other Ambulatory Visit: Payer: Self-pay

## 2019-10-04 ENCOUNTER — Ambulatory Visit (INDEPENDENT_AMBULATORY_CARE_PROVIDER_SITE_OTHER): Payer: 59 | Admitting: Family Medicine

## 2019-10-04 ENCOUNTER — Encounter: Payer: Self-pay | Admitting: Family Medicine

## 2019-10-04 VITALS — HR 75

## 2019-10-04 DIAGNOSIS — F411 Generalized anxiety disorder: Secondary | ICD-10-CM

## 2019-10-04 MED ORDER — CITALOPRAM HYDROBROMIDE 20 MG PO TABS: 20.0000 mg | ORAL_TABLET | Freq: Every day | ORAL | 3 refills | Status: DC

## 2019-10-04 NOTE — Progress Notes (Signed)
I have discussed the procedure for the virtual visit with the patient who has given consent to proceed with assessment and treatment.   Pt unable to obtain vitals.   Davidlee Jeanbaptiste L Kirkland Figg, CMA     

## 2019-10-04 NOTE — Progress Notes (Signed)
   Virtual Visit via Video   I connected with patient on 10/04/19 at 11:30 AM EST by a video enabled telemedicine application and verified that I am speaking with the correct person using two identifiers.  Location patient: Home Location provider: [facility], Office Persons participating in the virtual visit: Patient, Provider, CMA (Jess B)  I discussed the limitations of evaluation and management by telemedicine and the availability of in person appointments. The patient expressed understanding and agreed to proceed.  Subjective:   HPI:   Anxiety- pt saw Marcelline Mates PA-C on 1/18 and was started on Citalopram.  Pt reports 'i'm feeling better than I was a few weeks ago but still not great'.  Pt reports continued tearfulness, feeling overwhelmed, sadness.  Lost her dog in September and rescued a new dog which has worsened the anxiety.  'untrained 73 month old puppy'.  Sleep is hit or miss.  Anxiety is worse in AM- 'even before I'm awake'.  Pt is also in process of opening business.    ROS:   See pertinent positives and negatives per HPI.  Patient Active Problem List   Diagnosis Date Noted  . Physical exam 10/13/2016  . Left facial numbness 10/13/2016  . 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 Smoking status: Never Smoker  . Smokeless tobacco: Never Used  Substance Use Topics  . Alcohol use: Yes    Current Outpatient Medications:  .  ALPRAZolam (XANAX) 0.25 MG tablet, Take 1 tablet (0.25 mg total) by mouth 2 (two) times daily as needed for anxiety., Disp: 20 tablet, Rfl: 0 .  citalopram (CELEXA) 10 MG tablet, Take 1 tablet (10 mg total) by mouth daily., Disp: 30 tablet, Rfl: 1  Allergies  Allergen Reactions  . Tamiflu [Oseltamivir Phosphate] Other (See Comments)    Caused sleepwalking    Objective:   Pulse 75   AAOx3, NAD NCAT, EOMI No obvious CN deficits Coloring WNL Pt is able to speak  clearly, coherently without shortness of breath or increased work of breathing.  Thought process is linear.  Mood is appropriate.   Assessment and Plan:   Anxiety- pt reports sxs are improving but there is still room to go.  Feels most of her anxiety is related to taking on a new puppy while grieving the loss of her old dog.  Finds the training very overwhelming.  Will increase Citalopram to 20mg  daily and monitor for improvement.  She states diarrhea is better, but will have increased belching in the AM.  Suspect this is due to increased acid production from stress.  She will start OTC acid reducer and monitor for sx improvement.  Pt expressed understanding and is in agreement w/ plan.    , MD 10/04/2019

## 2019-10-28 ENCOUNTER — Ambulatory Visit (INDEPENDENT_AMBULATORY_CARE_PROVIDER_SITE_OTHER): Payer: 59 | Admitting: Family Medicine

## 2019-10-28 ENCOUNTER — Encounter: Payer: Self-pay | Admitting: Family Medicine

## 2019-10-28 ENCOUNTER — Other Ambulatory Visit: Payer: Self-pay

## 2019-10-28 ENCOUNTER — Other Ambulatory Visit: Payer: Self-pay | Admitting: Family Medicine

## 2019-10-28 VITALS — HR 82 | Ht 69.0 in | Wt 140.0 lb

## 2019-10-28 DIAGNOSIS — F411 Generalized anxiety disorder: Secondary | ICD-10-CM

## 2019-10-28 NOTE — Progress Notes (Signed)
   Virtual Visit via Video   I connected with patient on 10/28/19 at 10:30 AM EST by a video enabled telemedicine application and verified that I am speaking with the correct person using two identifiers.  Location patient: Home Location provider: [facility], Office Persons participating in the virtual visit: Patient, Provider, CMA (Jess B)  I discussed the limitations of evaluation and management by telemedicine and the availability of in person appointments. The patient expressed understanding and agreed to proceed.  Subjective:   HPI:   Anxiety- at last visit Citalopram was increased to 20mg  daily.  PHQ improved from 10 --> 3.  'I'm feeling a whole lot better'.  Wants to continue medication at this time but feels 'it was just an episode'.  Pt is very happy w/ improved mood.  ROS:   See pertinent positives and negatives per HPI.  Patient Active Problem List   Diagnosis Date Noted  . Physical exam 10/13/2016  . Left facial numbness 10/13/2016  . 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 Smoking status: Never Smoker  . Smokeless tobacco: Never Used  Substance Use Topics  . Alcohol use: Yes    Current Outpatient Medications:  .  ALPRAZolam (XANAX) 0.25 MG tablet, Take 1 tablet (0.25 mg total) by mouth 2 (two) times daily as needed for anxiety., Disp: 20 tablet, Rfl: 0 .  citalopram (CELEXA) 20 MG tablet, TAKE 1 TABLET BY MOUTH EVERY DAY, Disp: 90 tablet, Rfl: 2  Allergies  Allergen Reactions  . Tamiflu [Oseltamivir Phosphate] Other (See Comments)    Caused sleepwalking    Objective:   Pulse 82   Ht 5\' 9"  (1.753 m)   Wt 140 lb (63.5 kg)   BMI 20.67 kg/m   AAOx3, NAD NCAT, EOMI No obvious CN deficits Coloring WNL Pt is able to speak clearly, coherently without shortness of breath or increased work of breathing.  Thought process is linear.  Mood is appropriate.   Assessment and Plan:    Anxiety- much improved since increasing Citalopram to 20mg  daily.  Not needing to use Alprazolam as frequently.  Feels much more 'like myself'.  Will continue medication at this time and reassess in the future.  Pt expressed understanding and is in agreement w/ plan.    10/15/2016, MD 10/28/2019

## 2019-10-28 NOTE — Progress Notes (Signed)
I have discussed the procedure for the virtual visit with the patient who has given consent to proceed with assessment and treatment.   Yuvan Medinger L Latwan Luchsinger, CMA     

## 2019-11-29 ENCOUNTER — Encounter: Payer: Self-pay | Admitting: Family Medicine

## 2019-12-20 ENCOUNTER — Encounter: Payer: Self-pay | Admitting: Family Medicine

## 2019-12-20 DIAGNOSIS — F411 Generalized anxiety disorder: Secondary | ICD-10-CM

## 2019-12-21 MED ORDER — HYDROXYZINE HCL 25 MG PO TABS: 25.0000 mg | ORAL_TABLET | Freq: Three times a day (TID) | ORAL | 0 refills | Status: DC | PRN

## 2019-12-27 ENCOUNTER — Other Ambulatory Visit: Payer: Self-pay | Admitting: General Practice

## 2019-12-27 ENCOUNTER — Encounter: Payer: Self-pay | Admitting: Family Medicine

## 2019-12-27 DIAGNOSIS — R0602 Shortness of breath: Secondary | ICD-10-CM

## 2019-12-27 MED ORDER — ALBUTEROL SULFATE HFA 108 (90 BASE) MCG/ACT IN AERS: INHALATION_SPRAY | RESPIRATORY_TRACT | 3 refills | Status: DC

## 2020-02-03 ENCOUNTER — Encounter: Payer: 59 | Admitting: Family Medicine

## 2020-02-06 ENCOUNTER — Other Ambulatory Visit: Payer: Self-pay

## 2020-02-06 ENCOUNTER — Ambulatory Visit (INDEPENDENT_AMBULATORY_CARE_PROVIDER_SITE_OTHER): Payer: 59 | Admitting: Family Medicine

## 2020-02-06 ENCOUNTER — Encounter: Payer: Self-pay | Admitting: Family Medicine

## 2020-02-06 VITALS — BP 116/80 | HR 67 | Temp 98.0°F | Resp 16 | Ht 69.0 in | Wt 147.4 lb

## 2020-02-06 DIAGNOSIS — Z0000 Encounter for general adult medical examination without abnormal findings: Secondary | ICD-10-CM | POA: Diagnosis not present

## 2020-02-06 DIAGNOSIS — E7800 Pure hypercholesterolemia, unspecified: Secondary | ICD-10-CM | POA: Diagnosis not present

## 2020-02-06 DIAGNOSIS — E559 Vitamin D deficiency, unspecified: Secondary | ICD-10-CM | POA: Diagnosis not present

## 2020-02-06 LAB — CBC WITH DIFFERENTIAL/PLATELET
Basophils Absolute: 0.1 10*3/uL (ref 0.0–0.1)
Basophils Relative: 1 % (ref 0.0–3.0)
Eosinophils Absolute: 0.1 10*3/uL (ref 0.0–0.7)
Eosinophils Relative: 1 % (ref 0.0–5.0)
HCT: 38.7 % (ref 36.0–46.0)
Hemoglobin: 12.9 g/dL (ref 12.0–15.0)
Lymphocytes Relative: 23.3 % (ref 12.0–46.0)
Lymphs Abs: 1.3 10*3/uL (ref 0.7–4.0)
MCHC: 33.4 g/dL (ref 30.0–36.0)
MCV: 92.4 fl (ref 78.0–100.0)
Monocytes Absolute: 0.6 10*3/uL (ref 0.1–1.0)
Monocytes Relative: 11.1 % (ref 3.0–12.0)
Neutro Abs: 3.5 10*3/uL (ref 1.4–7.7)
Neutrophils Relative %: 63.6 % (ref 43.0–77.0)
Platelets: 235 10*3/uL (ref 150.0–400.0)
RBC: 4.19 Mil/uL (ref 3.87–5.11)
RDW: 14.2 % (ref 11.5–15.5)
WBC: 5.5 10*3/uL (ref 4.0–10.5)

## 2020-02-06 LAB — TSH: TSH: 1.05 u[IU]/mL (ref 0.35–4.50)

## 2020-02-06 LAB — HEPATIC FUNCTION PANEL
ALT: 18 U/L (ref 0–35)
AST: 18 U/L (ref 0–37)
Albumin: 4.8 g/dL (ref 3.5–5.2)
Alkaline Phosphatase: 51 U/L (ref 39–117)
Bilirubin, Direct: 0.1 mg/dL (ref 0.0–0.3)
Total Bilirubin: 0.5 mg/dL (ref 0.2–1.2)
Total Protein: 7.3 g/dL (ref 6.0–8.3)

## 2020-02-06 LAB — LIPID PANEL
Cholesterol: 191 mg/dL (ref 0–200)
HDL: 72.4 mg/dL (ref >39.00)
LDL Cholesterol: 110 mg/dL — ABNORMAL HIGH (ref 0–99)
NonHDL: 118.65
Total CHOL/HDL Ratio: 3
Triglycerides: 44 mg/dL (ref 0.0–149.0)
VLDL: 8.8 mg/dL (ref 0.0–40.0)

## 2020-02-06 LAB — BASIC METABOLIC PANEL
BUN: 10 mg/dL (ref 6–23)
CO2: 27 mEq/L (ref 19–32)
Calcium: 9.8 mg/dL (ref 8.4–10.5)
Chloride: 105 mEq/L (ref 96–112)
Creatinine, Ser: 0.75 mg/dL (ref 0.40–1.20)
GFR: 87.3 mL/min (ref >60.00)
Glucose, Bld: 100 mg/dL — ABNORMAL HIGH (ref 70–99)
Potassium: 3.6 mEq/L (ref 3.5–5.1)
Sodium: 140 mEq/L (ref 135–145)

## 2020-02-06 LAB — VITAMIN D 25 HYDROXY (VIT D DEFICIENCY, FRACTURES): VITD: 35.21 ng/mL (ref 30.00–100.00)

## 2020-02-06 NOTE — Assessment & Plan Note (Signed)
Pt's PE WNL.  UTD on Tdap.  Due for pap- pt to call GYN.  Check labs.  Anticipatory guidance provided.

## 2020-02-06 NOTE — Patient Instructions (Signed)
Follow up in 1 year or as needed We'll notify you of your lab results and make any changes if needed Keep up the good work!  You look great!! Call GYN and schedule your pap at your convenience Call with any questions or concerns Have a great summer!!

## 2020-02-06 NOTE — Progress Notes (Signed)
° °  Subjective:    Patient ID: Sherry Rodriguez, female    DOB: 23-Mar-1986, 34 y.o.   MRN: [id number]  HPI CPE- due for pap (needs to call GYNElon Spanner).  UTD on Tdap.  No concerns today.   Review of Systems Patient reports no vision/ hearing changes, adenopathy,fever, weight change,  persistant/recurrent hoarseness , swallowing issues, chest pain, palpitations, edema, persistant/recurrent cough, hemoptysis, dyspnea (rest/exertional/paroxysmal nocturnal), gastrointestinal bleeding (melena, rectal bleeding), abdominal pain, significant heartburn, bowel changes, GU symptoms (dysuria, hematuria, incontinence), Gyn symptoms (abnormal  bleeding, pain),  syncope, focal weakness, memory loss, numbness & tingling, skin/hair/nail changes, abnormal bruising or bleeding, anxiety, or depression.   This visit occurred during the SARS-CoV-2 public health emergency.  Safety protocols were in place, including screening questions prior to the visit, additional usage of staff PPE, and extensive cleaning of exam room while observing appropriate contact time as indicated for disinfecting solutions.       Objective:   Physical Exam General Appearance:    Alert, cooperative, no distress, appears stated age  Head:    Normocephalic, without obvious abnormality, atraumatic  Eyes:    PERRL, conjunctiva/corneas clear, EOM's intact, fundi    benign, both eyes  Ears:    Normal TM's and external ear canals, both ears  Nose:   Deferred due to COVID  Throat:   Neck:   Supple, symmetrical, trachea midline, no adenopathy;    Thyroid: no enlargement/tenderness/nodules  Back:     Symmetric, no curvature, ROM normal, no CVA tenderness  Lungs:     Clear to auscultation bilaterally, respirations unlabored  Chest Wall:    No tenderness or deformity   Heart:    Regular rate and rhythm, S1 and S2 normal, no murmur, rub   or gallop  Breast Exam:    Deferred to GYN  Abdomen:     Soft, non-tender, bowel sounds active all four quadrants,      no masses, no organomegaly  Genitalia:    Deferred to GYN  Rectal:    Extremities:   Extremities normal, atraumatic, no cyanosis or edema  Pulses:   2+ and symmetric all extremities  Skin:   Skin color, texture, turgor normal, no rashes or lesions  Lymph nodes:   Cervical, supraclavicular, and axillary nodes normal  Neurologic:   CNII-XII intact, normal strength, sensation and reflexes    throughout          Assessment & Plan:

## 2020-02-07 ENCOUNTER — Encounter: Payer: Self-pay | Admitting: General Practice

## 2020-03-01 ENCOUNTER — Encounter: Payer: Self-pay | Admitting: Family Medicine

## 2020-03-17 ENCOUNTER — Encounter: Payer: Self-pay | Admitting: Family Medicine

## 2020-06-06 ENCOUNTER — Encounter: Payer: Self-pay | Admitting: Family Medicine

## 2020-06-06 DIAGNOSIS — F411 Generalized anxiety disorder: Secondary | ICD-10-CM

## 2020-06-06 MED ORDER — CITALOPRAM HYDROBROMIDE 10 MG PO TABS
10.0000 mg | ORAL_TABLET | Freq: Every day | ORAL | 3 refills | Status: DC
Start: 2020-06-06 — End: 2020-06-29

## 2020-06-06 MED ORDER — HYDROXYZINE HCL 25 MG PO TABS: 25.0000 mg | ORAL_TABLET | Freq: Three times a day (TID) | ORAL | 0 refills | Status: DC | PRN

## 2020-06-29 ENCOUNTER — Other Ambulatory Visit: Payer: Self-pay | Admitting: Family Medicine

## 2020-06-29 NOTE — Telephone Encounter (Signed)
Patient requesting Celexa 10mg   From 30 tabs to 90 tabs 1xdaily  Last refill 06/06/2020

## 2020-09-14 ENCOUNTER — Encounter: Payer: Self-pay | Admitting: Family Medicine

## 2020-09-14 MED ORDER — ALPRAZOLAM 0.25 MG PO TABS: 0.2500 mg | ORAL_TABLET | Freq: Two times a day (BID) | ORAL | 0 refills | Status: DC | PRN

## 2020-09-19 ENCOUNTER — Encounter: Payer: Self-pay | Admitting: Family Medicine

## 2020-11-20 ENCOUNTER — Encounter: Payer: Self-pay | Admitting: Family Medicine

## 2020-11-20 ENCOUNTER — Other Ambulatory Visit: Payer: Self-pay

## 2020-11-20 DIAGNOSIS — F411 Generalized anxiety disorder: Secondary | ICD-10-CM

## 2020-11-20 MED ORDER — ALPRAZOLAM 0.25 MG PO TABS: 0.2500 mg | ORAL_TABLET | Freq: Two times a day (BID) | ORAL | 1 refills | Status: DC | PRN

## 2020-11-20 MED ORDER — HYDROXYZINE HCL 25 MG PO TABS: 25.0000 mg | ORAL_TABLET | Freq: Three times a day (TID) | ORAL | 0 refills | Status: DC | PRN

## 2020-11-22 DIAGNOSIS — D225 Melanocytic nevi of trunk: Secondary | ICD-10-CM | POA: Diagnosis not present

## 2020-11-22 DIAGNOSIS — A630 Anogenital (venereal) warts: Secondary | ICD-10-CM | POA: Diagnosis not present

## 2021-02-20 ENCOUNTER — Encounter: Payer: Self-pay | Admitting: *Deleted

## 2021-05-13 ENCOUNTER — Other Ambulatory Visit: Payer: Self-pay | Admitting: Family Medicine

## 2021-05-13 DIAGNOSIS — F411 Generalized anxiety disorder: Secondary | ICD-10-CM

## 2021-05-14 ENCOUNTER — Encounter: Payer: Self-pay | Admitting: Family Medicine

## 2021-05-14 ENCOUNTER — Other Ambulatory Visit: Payer: Self-pay

## 2021-05-14 DIAGNOSIS — F411 Generalized anxiety disorder: Secondary | ICD-10-CM

## 2021-05-14 MED ORDER — HYDROXYZINE HCL 25 MG PO TABS: 25.0000 mg | ORAL_TABLET | Freq: Three times a day (TID) | ORAL | 0 refills | Status: DC | PRN

## 2021-05-14 NOTE — Telephone Encounter (Signed)
Pt scheduled an appt with Tabori on 05/30/21 can we send in a refill to the [facility] in [place].Marland KitchenELEA

## 2021-05-30 ENCOUNTER — Ambulatory Visit (INDEPENDENT_AMBULATORY_CARE_PROVIDER_SITE_OTHER): Payer: 59 | Admitting: Family Medicine

## 2021-05-30 ENCOUNTER — Encounter: Payer: Self-pay | Admitting: Family Medicine

## 2021-05-30 ENCOUNTER — Other Ambulatory Visit: Payer: Self-pay

## 2021-05-30 VITALS — BP 110/70 | HR 77 | Temp 98.8°F | Resp 16 | Ht 69.5 in | Wt 151.4 lb

## 2021-05-30 DIAGNOSIS — Z0000 Encounter for general adult medical examination without abnormal findings: Secondary | ICD-10-CM | POA: Diagnosis not present

## 2021-05-30 DIAGNOSIS — E7800 Pure hypercholesterolemia, unspecified: Secondary | ICD-10-CM | POA: Diagnosis not present

## 2021-05-30 DIAGNOSIS — E559 Vitamin D deficiency, unspecified: Secondary | ICD-10-CM | POA: Diagnosis not present

## 2021-05-30 DIAGNOSIS — R0602 Shortness of breath: Secondary | ICD-10-CM

## 2021-05-30 LAB — BASIC METABOLIC PANEL
BUN: 11 mg/dL (ref 6–23)
CO2: 30 mEq/L (ref 19–32)
Calcium: 9.9 mg/dL (ref 8.4–10.5)
Chloride: 102 mEq/L (ref 96–112)
Creatinine, Ser: 0.91 mg/dL (ref 0.40–1.20)
GFR: 80.55 mL/min (ref >60.00)
Glucose, Bld: 112 mg/dL — ABNORMAL HIGH (ref 70–99)
Potassium: 4.2 mEq/L (ref 3.5–5.1)
Sodium: 140 mEq/L (ref 135–145)

## 2021-05-30 LAB — HEPATIC FUNCTION PANEL
ALT: 14 U/L (ref 0–35)
AST: 17 U/L (ref 0–37)
Albumin: 4.8 g/dL (ref 3.5–5.2)
Alkaline Phosphatase: 54 U/L (ref 39–117)
Bilirubin, Direct: 0.1 mg/dL (ref 0.0–0.3)
Total Bilirubin: 0.7 mg/dL (ref 0.2–1.2)
Total Protein: 7.6 g/dL (ref 6.0–8.3)

## 2021-05-30 LAB — CBC WITH DIFFERENTIAL/PLATELET
Basophils Absolute: 0 10*3/uL (ref 0.0–0.1)
Basophils Relative: 0.8 % (ref 0.0–3.0)
Eosinophils Absolute: 0.1 10*3/uL (ref 0.0–0.7)
Eosinophils Relative: 1 % (ref 0.0–5.0)
HCT: 41.3 % (ref 36.0–46.0)
Hemoglobin: 13.7 g/dL (ref 12.0–15.0)
Lymphocytes Relative: 21.5 % (ref 12.0–46.0)
Lymphs Abs: 1.3 10*3/uL (ref 0.7–4.0)
MCHC: 33.1 g/dL (ref 30.0–36.0)
MCV: 91.9 fl (ref 78.0–100.0)
Monocytes Absolute: 0.6 10*3/uL (ref 0.1–1.0)
Monocytes Relative: 9.8 % (ref 3.0–12.0)
Neutro Abs: 4 10*3/uL (ref 1.4–7.7)
Neutrophils Relative %: 66.9 % (ref 43.0–77.0)
Platelets: 269 10*3/uL (ref 150.0–400.0)
RBC: 4.5 Mil/uL (ref 3.87–5.11)
RDW: 14.2 % (ref 11.5–15.5)
WBC: 6 10*3/uL (ref 4.0–10.5)

## 2021-05-30 LAB — TSH: TSH: 0.68 u[IU]/mL (ref 0.35–5.50)

## 2021-05-30 LAB — LIPID PANEL
Cholesterol: 199 mg/dL (ref 0–200)
HDL: 85.6 mg/dL (ref >39.00)
LDL Cholesterol: 106 mg/dL — ABNORMAL HIGH (ref 0–99)
NonHDL: 113.42
Total CHOL/HDL Ratio: 2
Triglycerides: 38 mg/dL (ref 0.0–149.0)
VLDL: 7.6 mg/dL (ref 0.0–40.0)

## 2021-05-30 LAB — VITAMIN D 25 HYDROXY (VIT D DEFICIENCY, FRACTURES): VITD: 29.76 ng/mL — ABNORMAL LOW (ref 30.00–100.00)

## 2021-05-30 MED ORDER — ALBUTEROL SULFATE HFA 108 (90 BASE) MCG/ACT IN AERS: INHALATION_SPRAY | RESPIRATORY_TRACT | 3 refills | Status: DC

## 2021-05-30 NOTE — Assessment & Plan Note (Signed)
Pt's PE WNL.  Due for pap- pt to schedule.  UTD on Tdap.  Declines flu and COVID.  Check labs.  Anticipatory guidance provided.

## 2021-05-30 NOTE — Patient Instructions (Signed)
Follow up in 1 year or as needed We'll notify you of your lab results and make any changes if needed Keep up the good work on healthy diet and regular exercise- you look great! Call and schedule your pap and have them send me the report Call with any questions or concerns Happy Fall!!!

## 2021-05-30 NOTE — Progress Notes (Signed)
   Subjective:    Patient ID: Marguetta Windish, female    DOB: [birth year], 35 y.o.   MRN: [id number]  HPI CPE- UTD Tdap.  Not vaccinated against COVID.  Due for pap.  Health Maintenance  Topic Date Due   HIV Screening  Never done   Hepatitis C Screening  Never done   PAP SMEAR-Modifier  05/30/2021 (Originally 10/28/2019)   INFLUENZA VACCINE  11/22/2021 (Originally 03/25/2021)   TETANUS/TDAP  10/13/2026   HPV VACCINES  Aged Out    Review of Systems Patient reports no vision/ hearing changes, adenopathy,fever, weight change,  persistant/recurrent hoarseness , swallowing issues, chest pain, palpitations, edema, persistant/recurrent cough, hemoptysis, dyspnea (rest/exertional/paroxysmal nocturnal), gastrointestinal bleeding (melena, rectal bleeding), significant heartburn, bowel changes, GU symptoms (dysuria, hematuria, incontinence), Gyn symptoms (abnormal  bleeding, pain), focal weakness, memory loss, numbness & tingling, skin/hair/nail changes, abnormal bruising or bleeding, anxiety, or depression.   + epigastric pain x3 days but resolved + syncopal episode after a day at the beach w/o water and lots of alcohol  This visit occurred during the SARS-CoV-2 public health emergency.  Safety protocols were in place, including screening questions prior to the visit, additional usage of staff PPE, and extensive cleaning of exam room while observing appropriate contact time as indicated for disinfecting solutions.      Objective:   Physical Exam General Appearance:    Alert, cooperative, no distress, appears stated age  Head:    Normocephalic, without obvious abnormality, atraumatic  Eyes:    PERRL, conjunctiva/corneas clear, EOM's intact, fundi    benign, both eyes  Ears:    Normal TM's and external ear canals, both ears  Nose:   Deferred due to COVID  Throat:   Neck:   Supple, symmetrical, trachea midline, no adenopathy;    Thyroid: no enlargement/tenderness/nodules  Back:     Symmetric, no  curvature, ROM normal, no CVA tenderness  Lungs:     Clear to auscultation bilaterally, respirations unlabored  Chest Wall:    No tenderness or deformity   Heart:    Regular rate and rhythm, S1 and S2 normal, no murmur, rub   or gallop  Breast Exam:    Deferred to GYN  Abdomen:     Soft, non-tender, bowel sounds active all four quadrants,    no masses, no organomegaly  Genitalia:    Deferred to GYN  Rectal:    Extremities:   Extremities normal, atraumatic, no cyanosis or edema  Pulses:   2+ and symmetric all extremities  Skin:   Skin color, texture, turgor normal, no rashes or lesions  Lymph nodes:   Cervical, supraclavicular, and axillary nodes normal  Neurologic:   CNII-XII intact, normal strength, sensation and reflexes    throughout          Assessment & Plan:

## 2021-05-31 ENCOUNTER — Other Ambulatory Visit: Payer: Self-pay

## 2021-05-31 ENCOUNTER — Encounter: Payer: Self-pay | Admitting: Family Medicine

## 2021-05-31 DIAGNOSIS — E559 Vitamin D deficiency, unspecified: Secondary | ICD-10-CM

## 2021-05-31 MED ORDER — VITAMIN D (ERGOCALCIFEROL) 1.25 MG (50000 UNIT) PO CAPS: 50000.0000 [IU] | ORAL_CAPSULE | ORAL | 0 refills | Status: DC

## 2021-05-31 NOTE — Telephone Encounter (Signed)
Did you want to send in a rx for vitamin D?

## 2021-07-12 ENCOUNTER — Encounter: Payer: Self-pay | Admitting: Family Medicine

## 2021-07-13 DIAGNOSIS — X58XXXA Exposure to other specified factors, initial encounter: Secondary | ICD-10-CM | POA: Diagnosis not present

## 2021-07-13 DIAGNOSIS — H1031 Unspecified acute conjunctivitis, right eye: Secondary | ICD-10-CM | POA: Diagnosis not present

## 2021-07-13 DIAGNOSIS — T1591XA Foreign body on external eye, part unspecified, right eye, initial encounter: Secondary | ICD-10-CM | POA: Diagnosis not present

## 2021-07-13 DIAGNOSIS — S058X1A Other injuries of right eye and orbit, initial encounter: Secondary | ICD-10-CM | POA: Diagnosis not present

## 2021-08-05 ENCOUNTER — Encounter: Payer: Self-pay | Admitting: Family Medicine

## 2021-11-25 ENCOUNTER — Other Ambulatory Visit: Payer: Self-pay | Admitting: Family Medicine

## 2021-11-25 DIAGNOSIS — F411 Generalized anxiety disorder: Secondary | ICD-10-CM

## 2021-11-26 MED ORDER — HYDROXYZINE HCL 25 MG PO TABS: 25.0000 mg | ORAL_TABLET | Freq: Three times a day (TID) | ORAL | 0 refills | Status: DC | PRN

## 2021-12-11 ENCOUNTER — Ambulatory Visit (INDEPENDENT_AMBULATORY_CARE_PROVIDER_SITE_OTHER): Payer: BC Managed Care – PPO | Admitting: Family Medicine

## 2021-12-11 ENCOUNTER — Encounter: Payer: Self-pay | Admitting: Family Medicine

## 2021-12-11 VITALS — BP 104/70 | HR 69 | Ht 69.5 in | Wt 155.8 lb

## 2021-12-11 DIAGNOSIS — M25511 Pain in right shoulder: Secondary | ICD-10-CM

## 2021-12-11 DIAGNOSIS — M542 Cervicalgia: Secondary | ICD-10-CM

## 2021-12-11 DIAGNOSIS — M5010 Cervical disc disorder with radiculopathy, unspecified cervical region: Secondary | ICD-10-CM

## 2021-12-11 MED ORDER — PREDNISONE 50 MG PO TABS
50.0000 mg | ORAL_TABLET | Freq: Every day | ORAL | 0 refills | Status: AC
Start: 2021-12-11 — End: 2021-12-16

## 2021-12-11 MED ORDER — GABAPENTIN 300 MG PO CAPS: 300.0000 mg | ORAL_CAPSULE | Freq: Three times a day (TID) | ORAL | 2 refills | Status: DC

## 2021-12-11 MED ORDER — TIZANIDINE HCL 4 MG PO TABS: 4.0000 mg | ORAL_TABLET | Freq: Three times a day (TID) | ORAL | 1 refills | Status: DC | PRN

## 2021-12-11 NOTE — Progress Notes (Signed)
? ? ?  Subjective:   ? ?CC: Neck and R arm pain ? ?I, Christoper Fabian, LAT, ATC, am serving as scribe for Dr. Clementeen Graham. ? ?HPI: Pt is a 36 y/o female presenting w/ c/o neck and R arm pain x approximately one week that may have happened in yoga.  She locates her pain to her R superior shoulder and R lateral neck w/ intermittent radiating pain into her entire R arm. ? ?Radiating pain: yes into her R UE all the way to her hand ?Neck pain: yes, R-sided ?Aggravating factors: quadruped; R shoulder overhead AROM; laying supine ?Treatments tried: IBU, massage, IcyHot ? ?Pertinent review of Systems: No fevers or chills ? ?Relevant historical information: Yoga instructor. ? ? ?Objective:   ? ?Vitals:  ? 12/11/21 1102  ?BP: 104/70  ?Pulse: 69  ?SpO2: 99%  ? ?General: Well Developed, well nourished, and in no acute distress.  ? ?MSK: C-spine: Normal. ?Nontender midline. ?Tender palpation right cervical paraspinal musculature and trapezius. ?Decreased cervical motion. ?Positive right-sided Spurling's test. ?Upper extremity strength slightly diminished 4+5 to elbow flexion (C6) ?Otherwise upper semistrength is equal and intact bilateral upper extremities. ?Reflexes are intact. ?Sensation is intact throughout. ? ?Right shoulder normal-appearing ?Nontender at bony prominences.  Tender palpation right trapezius. ?Normal shoulder range of motion pain with abduction felt and trapezius area. ?Negative Hawkins and Neer's test. ? ? ?Impression and Recommendations:   ? ?Assessment and Plan: ?36 y.o. female with right neck and shoulder pain thought to be due to primarily cervical radiculopathy most likely C6.  She also I think has trapezius or cervical paraspinal muscle spasm or dysfunction.  I think shoulder rotator cuff tendinopathy is less likely.  Plan for trial of physical therapy.  Her symptoms are more severe so we will use medications for now.  Short course of prednisone and trial of gabapentin.  Limited tizanidine at bedtime as  an option as well.  Recommend heat and TENS unit as well.  Check back in 1 month.  If not improved or if worsening consider x-ray and even cervical spine MRI. ? ?PDMP not reviewed this encounter. ?Orders Placed This Encounter  ?Procedures  ? Ambulatory referral to Physical Therapy  ?  Referral Priority:   Routine  ?  Referral Type:   Physical Medicine  ?  Referral Reason:   Specialty Services Required  ?  Requested Specialty:   Physical Therapy  ?  Number of Visits Requested:   1  ? ?Meds ordered this encounter  ?Medications  ? gabapentin (NEURONTIN) 300 MG capsule  ?  Sig: Take 1 capsule (300 mg total) by mouth 3 (three) times daily.  ?  Dispense:  90 capsule  ?  Refill:  2  ? predniSONE (DELTASONE) 50 MG tablet  ?  Sig: Take 1 tablet (50 mg total) by mouth daily with breakfast for 5 days.  ?  Dispense:  5 tablet  ?  Refill:  0  ? tiZANidine (ZANAFLEX) 4 MG tablet  ?  Sig: Take 1 tablet (4 mg total) by mouth every 8 (eight) hours as needed for muscle spasms.  ?  Dispense:  30 tablet  ?  Refill:  1  ? ? ?Discussed warning signs or symptoms. Please see discharge instructions. Patient expresses understanding. ? ? ?The above documentation has been reviewed and is accurate and complete Clementeen Graham, M.D. ? ?

## 2021-12-11 NOTE — Patient Instructions (Addendum)
Nice to meet you. ? ?I've prescribed you prednisone, tizanidine and Gabapentin. ? ?I've referred you to Physical Therapy at [facility].  Their office will call you to schedule but please let us know if you haven't heard from them in one week regarding scheduling. ? ?Follow-up: 4 weeks ?

## 2021-12-26 ENCOUNTER — Telehealth: Payer: Self-pay | Admitting: Family Medicine

## 2021-12-26 MED ORDER — CYCLOBENZAPRINE HCL 5 MG PO TABS: 5.0000 mg | ORAL_TABLET | Freq: Every evening | ORAL | 1 refills | Status: DC | PRN

## 2021-12-26 NOTE — Telephone Encounter (Signed)
I called Reha back.  She is having what sounds like a pretty bad episode of cervical paraspinal or scalene muscle spasm.  Plan for trial of Flexeril at bedtime.  She has PT starting Monday.  If not sufficient may consider MRI earlier or recheck with me earlier.  She will keep me updated. ?

## 2021-12-26 NOTE — Telephone Encounter (Signed)
Patient called stating that the Tizanidine that she has been taking at night is not helping. She even tried taking two and her shoulder is still having spasms.  ?Is there anything else she can try? ? ?She has not started PT yet. I gave her their information and she is going to call them to set up an appointment. ? ?She is still having a lot of pain. Hoping that maybe a change in medication and PT would help? ?

## 2021-12-30 ENCOUNTER — Encounter: Payer: Self-pay | Admitting: Physical Therapy

## 2021-12-30 ENCOUNTER — Ambulatory Visit (INDEPENDENT_AMBULATORY_CARE_PROVIDER_SITE_OTHER): Payer: BC Managed Care – PPO | Admitting: Physical Therapy

## 2021-12-30 DIAGNOSIS — M6281 Muscle weakness (generalized): Secondary | ICD-10-CM

## 2021-12-30 DIAGNOSIS — M79601 Pain in right arm: Secondary | ICD-10-CM | POA: Diagnosis not present

## 2021-12-30 DIAGNOSIS — M542 Cervicalgia: Secondary | ICD-10-CM

## 2021-12-30 NOTE — Therapy (Signed)
?OUTPATIENT PHYSICAL THERAPY CERVICAL EVALUATION ? ? ?Patient Name: Sherry Rodriguez ?MRN: [id number] ?DOB:05/31/1986, 36 y.o., female ?Today's Date: 12/30/2021 ? ? PT End of Session - 12/30/21 1320   ? ? Visit Number 1   ? Number of Visits 12   ? Date for PT Re-Evaluation 02/10/22   ? Authorization - Visit Number 1   ? Authorization - Number of Visits 30   ? Progress Note Due on Visit 10   ? PT Start Time 1217   ? PT Stop Time 1319   ? PT Time Calculation (min) 62 min   ? Activity Tolerance Patient limited by pain   ? Behavior During Therapy Restless;Anxious   ? ?  ?  ? ?  ? ? ?History reviewed. No pertinent past medical history. ?Past Surgical History:  ?Procedure Laterality Date  ? CESAREAN SECTION    ? ?Patient Active Problem List  ? Diagnosis Date Noted  ? Physical exam 10/13/2016  ? Left facial numbness 10/13/2016  ? 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 ? ?PCP: Sheliah Hatch ? ?REFERRING PROVIDER: Rodolph Bong ? ?REFERRING DIAG: M54.2 (ICD-10-CM) - Neck pain ?M50.10 (ICD-10-CM) - Cervical disc disorder with radiculopathy of cervical region ? ?THERAPY DIAG:  ?Cervicalgia ? ?Muscle weakness (generalized) ? ?Pain in right arm ? ?ONSET DATE: 4 weeks ago ? ?SUBJECTIVE:                                                                                                                                                                                                        ? ?SUBJECTIVE STATEMENT: ? States about 4 weeks ago she woke up with neck and arm pain. States that she did a yogi stick up and had a little bit of pain the next day. States she has had 6 chiropractor adjustments and 3 massages and nothing has helped. States she has been rolling around on a ennis ball. Always worse at night. 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 States that she now has flexoril which seems ot be helping. States that she got a new mattress in decemeber which seemed to helped it for a little bit but now she  is having pain. States she sleeps on her left side and supine with pillows propped up neck. She has tried heat/ice and nothing seems to be helping. ? ?PERTINENT HISTORY:  ?hx of neck pain ? ?PAIN:  ?Are you having pain? Yes: NPRS scale: 3-4/10 ?Pain location: right upper shoulder and radiating into right arm. ?Pain description: aching, lava poring down arm ?  Aggravating factors: worse at night 8/10, weightbearing and laying flat on her back.  ?Relieving factors: nerve flossing ? ?PRECAUTIONS: None ? ?WEIGHT BEARING RESTRICTIONS No ? ?FALLS:  ?Has patient fallen in last 6 months? No ? ?  ?OCCUPATION: yoga instructor ? ?PLOF: Independent ? ?PATIENT GOALS to be able to have less pain and function ? ?OBJECTIVE:  ? ?DIAGNOSTIC FINDINGS:  ?None at this time ? ?  ? ?COGNITION: ?Overall cognitive status: Within functional limits for tasks assessed ? ? ?SENSATION: ?WFL ? ?POSTURE:  ?Forward head, rounded shoulders ? ?PALPATION: ?Tenderness to palpation along anterior and lateral neck into right shoulder - increased resting tone noted throughout.   ? ?CERVICAL ROM:  ? ?Active ROM A/PROM (deg) ?12/30/2021  ?Flexion WFL*  ?Extension WFL*  ?Right lateral flexion WFL  ?Left lateral flexion WFL  ?Right rotation   ?Left rotation   ? (Blank rows = not tested) ?*pain ? ? ? UE Measurements ?Upper Extremity Right ?12/30/2021 Left ?12/30/2021  ? A/PROM MMT A/PROM MMT  ?Shoulder Flexion WFL  4*  4+  ?Shoulder Extension      ?Shoulder Abduction  4*  4+  ?Shoulder Adduction      ?Shoulder Internal Rotation Reaches to L2 SP* 4+* Reaches to T12 SP 4+  ?Shoulder External Rotation Reaches to T4 SP* 4* Reaches to T4 SP* 4+  ?Elbow Flexion      ?Elbow Extension      ?Wrist Flexion      ?Wrist Extension      ?Wrist Supination      ?Wrist Pronation      ?Wrist Ulnar Deviation      ?Wrist Radial Deviation      ?Grip Strength NA  NA   ?  (Blank rows = not tested) ?  * pain ? ? ?CERVICAL SPECIAL TESTS:  ?Distraction - painful, Spurling - painful   ? ? ?Interventions ?12/30/2021 ?Therapeutic Exercise: ? Aerobic: ?Supine: small chin tucks x15 ?Prone: ? Seated:small chin tucks x15, breath work 8 minutes ? Standing: ?Neuromuscular Re-education: ?Manual Therapy: STM to right trap - left s/l 10 minutes ? ? ?PATIENT EDUCATION:  ?Education details: on current presentation, on HEP, on clinical outcomes score and POC, on spasm/pain cycle on interventions, on gentle motions on posture, on breath work ?Person educated: Patient ?Education method: Explanation, Demonstration, and Handouts ?Education comprehension: verbalized understanding ? ? ? ?HOME EXERCISE PROGRAM: ?Indirection motion, gentle massage ? ?ASSESSMENT: ? ?CLINICAL IMPRESSION: ?Patient is a 36 y.o. female who was seen today for physical therapy evaluation and treatment for right sided neck pain radiating into arm. Patient presenting with muscle spasms resulting in radicular symptoms and poor tolerance to today's interventions. Educated patient on gentle motions, avoiding painful motions and breath work. Answered all questions and reassured patient during session. Patient would greatly benefit from skilled PT to improve overall function and QOL ? ? ?OBJECTIVE IMPAIRMENTS decreased activity tolerance, decreased ROM, decreased strength, increased muscle spasms, postural dysfunction, and pain.  ? ?ACTIVITY LIMITATIONS cleaning, community activity, occupation, and shopping.  ? ?PERSONAL FACTORS Time since onset of injury/illness/exacerbation and 1 comorbidity: history of neck pain  are also affecting patient's functional outcome.  ? ? ?REHAB POTENTIAL: Good ? ?CLINICAL DECISION MAKING: Stable/uncomplicated ? ?EVALUATION COMPLEXITY: Low ? ?GOALS: ?Goals reviewed with patient?  yes ? ?SHORT TERM GOALS: ? ?Patient will be independent in self management strategies to improve quality of life and functional outcomes. ?Baseline: new program ?Target date: 01/20/2022 ?Goal status: INITIAL ? ?2.  Patient will report  at least  50% improvement in overall symptoms and/or function to demonstrate improved functional mobility ?Baseline: 0% ?Target date: 01/20/2022 ?Goal status: INITIAL ? ?3.  Patient will be able to lay supine without pain to improve ability to participate in supine yoga poses ?Baseline: unable ?Target date: 01/20/2022 ?Goal status: INITIAL ? ? ? ? ? ?LONG TERM GOALS: ? ?Patient will report at least 75% improvement in overall symptoms and/or function to demonstrate improved functional mobility ?Baseline: 0% ?Target date: 02/10/2022 ?Goal status: INITIAL ? ?2.  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 ?3.  Patient will be demonstrate painfree MMT ?Baseline: painful ?Target date: 02/10/2022 ?Goal status: INITIAL ? ?  ? ? ?PLAN: ?PT FREQUENCY: 2x/week ? ?PT DURATION: 6 weeks ? ?PLANNED INTERVENTIONS: Therapeutic exercises, Therapeutic activity, Neuromuscular re-education, Balance training, Gait training, Patient/Family education, Joint manipulation, Joint mobilization, Canalith repositioning, Dry Needling, Electrical stimulation, Spinal manipulation, Spinal mobilization, Cryotherapy, Moist heat, Ionotophoresis 4mg /ml Dexamethasone, and Manual therapy ? ?PLAN FOR NEXT SESSION: gentle STM, DN?, estim, gentle ROM, indirection motions/STM ? ?1:22 PM, 12/30/21 ?Tereasa CoopMichele Murray Guzzetta, DPT ?Physical Therapy with [facility] ? ? ?  ?

## 2021-12-31 ENCOUNTER — Ambulatory Visit (INDEPENDENT_AMBULATORY_CARE_PROVIDER_SITE_OTHER): Payer: BC Managed Care – PPO | Admitting: Family Medicine

## 2021-12-31 ENCOUNTER — Telehealth: Payer: Self-pay | Admitting: Family Medicine

## 2021-12-31 ENCOUNTER — Encounter: Payer: Self-pay | Admitting: Family Medicine

## 2021-12-31 VITALS — BP 136/79 | HR 78 | Temp 98.7°F | Ht 69.5 in | Wt 152.2 lb

## 2021-12-31 DIAGNOSIS — R09A2 Foreign body sensation, throat: Secondary | ICD-10-CM

## 2021-12-31 DIAGNOSIS — M542 Cervicalgia: Secondary | ICD-10-CM

## 2021-12-31 DIAGNOSIS — R0989 Other specified symptoms and signs involving the circulatory and respiratory systems: Secondary | ICD-10-CM

## 2021-12-31 DIAGNOSIS — F419 Anxiety disorder, unspecified: Secondary | ICD-10-CM

## 2021-12-31 MED ORDER — ALPRAZOLAM 0.25 MG PO TABS: 0.2500 mg | ORAL_TABLET | Freq: Two times a day (BID) | ORAL | 1 refills | Status: DC | PRN

## 2021-12-31 MED ORDER — PANTOPRAZOLE SODIUM 40 MG PO TBEC: 40.0000 mg | DELAYED_RELEASE_TABLET | Freq: Every day | ORAL | 3 refills | Status: DC

## 2021-12-31 NOTE — Telephone Encounter (Signed)
Pt was seen today at the [facility] location . I spoke w/ the pt @ 1221 pm . Dx w/ Globus.  She wanted to see you to talk about her Xanax Rx. The DR she seen today stated that this condition is brought on by stress. Please advise. Pt states she does need a refill on Xanax .  ?

## 2021-12-31 NOTE — Telephone Encounter (Signed)
Chief Complaint Swallowing Difficulty ?Reason for Call Symptomatic / Request for Health Information ?Initial Comment Caller states she has a gold ball size lump in her ?throat and its difficult to swallow. ?Translation No ?Nurse Assessment ?Nurse: Annye English RN, Denise Date/Time (Eastern Time): 12/31/2021 8:50:18 AM ?Confirm and document reason for call. If ?symptomatic, describe symptoms. ?---Caller states she has a large lump on the center of ?her neck at her [place]. ?Does the patient have any new or worsening ?symptoms? ---Yes ?Will a triage be completed? ---Yes ?Related visit to physician within the last 2 weeks? ---No ?Does the PT have any chronic conditions? (i.e. ?diabetes, asthma, this includes High risk factors for ?pregnancy, etc.) ?---No ?Is the patient pregnant or possibly pregnant? (Ask ?all females between the ages of 18-55) ---No ?Is this a behavioral health or substance abuse call? ---No ?Guidelines ?Guideline Title Affirmed Question Affirmed Notes Nurse Date/Time (Eastern ?Time) ?Lymph Nodes - ?Swollen ?[1] Single large node ?AND [2] size > 1 ?inch (2.5 cm) AND ?[3] no fever ?Carmon, RN, Angelique Blonder 12/31/2021 8:52:13 AM ?Disp. Time (Eastern ?Time) Disposition Final User ?12/31/2021 8:54:13 AM See PCP within 24 Hours Yes Carmon, RN, Angelique Blonder ?PLEASE NOTE: All timestamps contained within this report are represented as [place] Standard Time. ?CONFIDENTIALTY NOTICE: This fax transmission is intended only for the addressee. It contains information that is legally privileged, confidential or ?otherwise protected from use or disclosure. If you are not the intended recipient, you are strictly prohibited from reviewing, disclosing, copying using ?or disseminating any of this information or taking any action in reliance on or regarding this information. If you have received this fax in error, please ?notify us immediately by telephone so that we can arrange for its return to [place]. Phone: [phone], Toll-Free:  [phone], Fax: [phone] ?Page: 2 of 2 ?Call Id: [id number] ?Caller Disagree/Comply Comply ?Caller Understands Yes ?PreDisposition Call Doctor ?Care Advice Given Per Guideline ?SEE PCP WITHIN 24 HOURS: CALL BACK IF: * You become worse CARE ADVICE given per Lymph Nodes Swollen (Adult) ?guideline. ?Comments ?User: Greggory Stallion, RN Date/Time Lamount Cohen Time): 12/31/2021 8:56:01 AM ?Pt states she has to swallow hard to get past the lump on her neck. Adviesd to stay on soft foods and liquids only. ?Referrals ?REFERRED TO PCP OFFICE ?

## 2021-12-31 NOTE — Patient Instructions (Signed)
It was very nice to see you today! ? ?You have globus. ? ?There are many reasons why this can happen though this is a benign condition. ? ?Please take the Protonix.  Please take the alprazolam tonight.  Let us know if not improving. ? ?Take care, ?Dr Jimmey Ralph ? ?PLEASE NOTE: ? ?If you had any lab tests please let us know if you have not heard back within a few days. You may see your results on mychart before we have a chance to review them but we will give you a call once they are reviewed by [place]. If we ordered any referrals today, please let us know if you have not heard from their office within the next week.  ? ?Please try these tips to maintain a healthy lifestyle: ? ?Eat at least 3 REAL meals and 1-2 snacks per day.  Aim for no more than 5 hours between eating.  If you eat breakfast, please do so within one hour of getting up.  ? ?Each meal should contain half fruits/vegetables, one quarter protein, and one quarter carbs (no bigger than a computer mouse) ? ?Cut down on sweet beverages. This includes juice, soda, and sweet tea.  ? ?Drink at least 1 glass of water with each meal and aim for at least 8 glasses per day ? ?Exercise at least 150 minutes every week.   ? ?Globus Pharyngeus ?Globus pharyngeus is a condition that makes a person feel like there is a lump in the throat. It may also feel like there is something stuck in the front of the throat. It is not painful, and it does not make it harder to swallow food or liquid. Globus pharyngeus does not cause changes that a health care provider can see during a physical exam. This problem usually comes and goes away, but may last a long time when it occurs. The problem usually does not require treatment. ?Globus pharyngeus may be caused by: ?Stomach juices that flow back up into the throat (gastroesophageal reflux). ?Irritation of nerves that control swallowing (neuralgia). ?Anxiety, or other mood states such as grief or depression. ?Irritation from smoking  cigarettes, drinking alcohol, or drinking beverages with caffeine. ?Your health care provider will do exams and tests to find the cause of your condition and to rule out other serious conditions. ?Follow these instructions at home: ? ?  ? ?Watch your condition for any changes. Take these steps to help with your condition: ?Take over-the-counter and prescription medicines only as told by your health care provider. ?Follow instructions from your health care provider about any eating or drinking restrictions. This may include reducing alcohol and caffeine intake. ?Avoid stress and practice relaxation exercises as told by your health care provider. ?Do not use any products that contain nicotine or tobacco, such as cigarettes, e-cigarettes, and chewing tobacco. If you need help quitting, ask your health care provider. ?It is up to you to get the results of any tests that were done. Ask your health care provider, or the department that is doing the tests, when your results will be ready. ?Keep all follow-up visits as told by your health care provider. This is important. ?Contact a health care provider if: ?Your symptoms get worse. ?You have throat pain. ?You have trouble swallowing. ?Food or liquid comes back up into your mouth. ?You lose weight without trying. ?Get help right away if: ?You develop swelling in your throat. ?Summary ?Globus pharyngeus is a condition that makes you feel like you have a lump  in your throat. ?This condition does not cause pain, and it does not make it harder to swallow food or liquid. This condition usually goes away without treatment. ?Follow instructions about eating and drinking. You may be asked to avoid alcohol and beverages that have caffeine. ?Watch your condition for any changes. Let your health care provider know about them. ?This information is not intended to replace advice given to you by your health care provider. Make sure you discuss any questions you have with your health care  provider. ?Document Revised: 07/08/2019 Document Reviewed: 07/08/2019 ?Elsevier Patient Education ? 2023 [employer] ? ?

## 2021-12-31 NOTE — Progress Notes (Signed)
? ?  Sherry Rodriguez is a 36 y.o. female who presents today for an office visit. ? ?Assessment/Plan:  ?Globus pharyngeus ?No red flags.  Reassuring exam today.  No history concerning for foreign body or mass effect.  We discussed likely diagnosis of globus pharyngeus with patient.  She has been under a fair amount of stress recently with she is managing with her PCP.  She also has been dealing with tight shoulder muscles which could be contributing as well.  We will start PPI with Protonix 40 mg daily.  Advised her to take a dose of alprazolam tonight as well.  Patient was reassured that her condition is benign and typically self-limited.  Discussed importance of drinking caffeine as well.  She will let me or her PCP know if not improving in the next couple days.  Will consider referral to GI for further evaluation at that point. ? ?Neck pain/cervicalgia ?Continue management per sports medicine.  Could be contributing to her above symptoms.  She will continue taking muscle relaxers and gabapentin. ? ?Anxiety ?Patient intermittently tearful on exam today due to recently diagnoses.  Her anxiety is likely contributing to above as well.  She will continue Xanax 0.25 mg twice daily as needed per her PCP and recommended that she follow up with them soon.  ? ?  ?Subjective:  ?HPI: ? ?Patient here with feeling of lump in throat. Started 2 days ago after waking up. No pain. Hard to swallow. Has been trying to swish with salt water. Nothing like this in the past. Tried taking some immunity boosters which did not help significantly.  No cough or sneeze. No sore throat. No heart burn or reflux. No fevers or chills. No nausea or vomiting.  She has been dealing with tightness in her shoulder muscles.  She has been following with sports medicine and physical therapy for this.  Muscle relaxers have not seemed to help significantly. ? ?   ?  ?Objective:  ?Physical Exam: ?BP 136/79   Pulse 78   Temp 98.7 ?F (37.1 ?C) (Temporal)   Ht  5' 9.5" (1.765 m)   Wt 152 lb 3.2 oz (69 kg)   LMP 12/30/2021 (Exact Date)   SpO2 99%   BMI 22.15 kg/m?   ?Gen: No acute distress, resting comfortably ?HEENT: TMs clear.  OP clear.  No exudate.  No tonsillar hypertrophy.  No thyromegaly. ?CV: Regular rate and rhythm with no murmurs appreciated ?Pulm: Normal work of breathing, clear to auscultation bilaterally with no crackles, wheezes, or rhonchi ?Neuro: Grossly normal, moves all extremities ?Psych: Intermittently tearful. Otherwise normal affect and thought content ? ?   ? ?Katina Degree. Jimmey Ralph, MD ?12/31/2021 11:55 AM  ?

## 2021-12-31 NOTE — Telephone Encounter (Signed)
I will send a refill on the Alprazolam but she needs to schedule an appt to discuss increased stress and possible options going forward. ?

## 2022-01-01 ENCOUNTER — Ambulatory Visit (INDEPENDENT_AMBULATORY_CARE_PROVIDER_SITE_OTHER): Payer: BC Managed Care – PPO | Admitting: Physical Therapy

## 2022-01-01 ENCOUNTER — Encounter: Payer: Self-pay | Admitting: Physical Therapy

## 2022-01-01 DIAGNOSIS — M542 Cervicalgia: Secondary | ICD-10-CM | POA: Diagnosis not present

## 2022-01-01 DIAGNOSIS — M6281 Muscle weakness (generalized): Secondary | ICD-10-CM

## 2022-01-01 NOTE — Telephone Encounter (Signed)
Left pt a VM asking to schedule an appt .  ?

## 2022-01-01 NOTE — Therapy (Signed)
?OUTPATIENT PHYSICAL THERAPY TREATMENT NOTE ? ? ?Patient Name: Sherry Rodriguez ?MRN: [id number] ?DOB:1986/03/22, 36 y.o., female ?Today's Date: 01/01/2022 ? ?END OF SESSION:  ? PT End of Session - 01/01/22 1301   ? ? Visit Number 2   ? Number of Visits 12   ? Date for PT Re-Evaluation 02/10/22   ? Authorization - Visit Number 2   ? Authorization - Number of Visits 30   ? Progress Note Due on Visit 10   ? PT Start Time 1303   ? PT Stop Time 1343   ? PT Time Calculation (min) 40 min   ? Activity Tolerance Patient limited by pain   ? Behavior During Therapy Restless;Anxious   ? ?  ?  ? ?  ? ? ?History reviewed. No pertinent past medical history. ?Past Surgical History:  ?Procedure Laterality Date  ? CESAREAN SECTION    ? ?Patient Active Problem List  ? Diagnosis Date Noted  ? Physical exam 10/13/2016  ? Left facial numbness 10/13/2016  ? 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 ?  ?  ?PCP: Sheliah Hatch ?  ?REFERRING PROVIDER: Rodolph Bong ?  ?REFERRING DIAG: M54.2 (ICD-10-CM) - Neck pain ?M50.10 (ICD-10-CM) - Cervical disc disorder with radiculopathy of cervical region ?  ?THERAPY DIAG:  ?Cervicalgia ?  ?Muscle weakness (generalized) ?  ?Pain in right arm ?  ?ONSET DATE: 4 weeks ago ?  ?SUBJECTIVE:                                                                                                                                                                                                        ?  ?SUBJECTIVE STATEMENT: ?01/01/2022 ?States that she is feels good after taking a xanax and pain is better was feeling it yesterday evening but as bad. States she can swallow better and the lump in her throat is completely gone. ? ?Eval: States about 4 weeks ago she woke up with neck and arm pain. States that she did a yogi stick up and had a little bit of pain the next day. States she has had 6 chiropractor adjustments and 3 massages and nothing has helped. States she has been rolling around on a  ennis ball. Always worse at night. 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 States that she now has flexoril which seems ot be helping. States that she got a new mattress in decemeber which seemed to helped it for a little bit but now she is having pain. States she sleeps on her left side  and supine with pillows propped up neck. She has tried heat/ice and nothing seems to be helping. ?  ?PERTINENT HISTORY:  ?hx of neck pain ?  ?PAIN:  ?Are you having pain? Yes: NPRS scale: 2/10 ?Pain location: right upper shoulder and radiating into right arm. ?Pain description: aching, lava poring down arm ?Aggravating factors: worse at night 8/10, weightbearing and laying flat on her back.  ?Relieving factors: nerve flossing ?  ?PRECAUTIONS: None ?  ?WEIGHT BEARING RESTRICTIONS No ?  ?FALLS:  ?Has patient fallen in last 6 months? No ?  ?  ?OCCUPATION: yoga instructor ?  ?PLOF: Independent ?  ?PATIENT GOALS to be able to have less pain and function ?  ?OBJECTIVE:  ?  ?DIAGNOSTIC FINDINGS:  ?None at this time ?  ?  ?  ?COGNITION: ?Overall cognitive status: Within functional limits for tasks assessed ?  ?  ?SENSATION: ?WFL ?  ?POSTURE:  ?Forward head, rounded shoulders ?  ?PALPATION: ?Tenderness to palpation along anterior and lateral neck into right shoulder - increased resting tone noted throughout.     ?  ?CERVICAL ROM:  ?  ?Active ROM A/PROM (deg) ?12/30/2021  ?Flexion WFL*  ?Extension WFL*  ?Right lateral flexion WFL  ?Left lateral flexion WFL  ?Right rotation    ?Left rotation    ? (Blank rows = not tested) ?*pain ?  ?  ?           UE Measurements ?      ?Upper Extremity Right ?12/30/2021 Left ?12/30/2021  ?  A/PROM MMT A/PROM MMT  ?Shoulder Flexion WFL  4*   4+  ?Shoulder Extension          ?Shoulder Abduction   4*   4+  ?Shoulder Adduction          ?Shoulder Internal Rotation Reaches to L2 SP* 4+* Reaches to T12 SP 4+  ?Shoulder External Rotation Reaches to T4 SP* 4* Reaches to T4 SP* 4+  ?Elbow Flexion          ?Elbow Extension           ?Wrist Flexion          ?Wrist Extension          ?Wrist Supination          ?Wrist Pronation          ?Wrist Ulnar Deviation          ?Wrist Radial Deviation          ?Grip Strength NA   NA    ?                      (Blank rows = not tested) ?                      * pain ?  ?  ?CERVICAL SPECIAL TESTS:  ?Distraction - painful, Spurling - painful  ?  ?  ?Interventions ?01/01/2022 ?Therapeutic Exercise: ?           Quadruped:unable pain ?Supine:unable pain ?Prone: unable pain ?           Seated: long exhales breath work to reduce pain - 6 minutes  ?           tall kneeling with posterior wall support:small chin tucks x15, breath work 8 minutes - diaphragmatic breathing  ?Neuromuscular Re-education: ?Manual Therapy: STM to right trap - left s/l 10 minutes, scapular mobility PROM with STM R in  s/l 8 minutes  ?  ?  ?PATIENT EDUCATION:  ?Education details: on current presentation, on HEP, on clinical outcomes score and POC, on spasm/pain cycle on interventions, on gentle motions on posture, on breath work ?Person educated: Patient ?Education method: Explanation, Demonstration, and Handouts ?Education comprehension: verbalized understanding ?  ?  ?  ?HOME EXERCISE PROGRAM: ?Indirection motion, gentle massage ?  ?ASSESSMENT: ?  ?CLINICAL IMPRESSION: ?01/01/2022 ?Session trailed different positions but continued difficulties with all positions but left side lying and tall kneeling. Educated patient on current presentation and reaching out to MD as they have been trying to get a hold of her. Poor tolerance to some interventions today but improvement from last session. Added diaphragmatic breathing to HEP and will follow up with this next session ? ?Eval: Patient is a 36 y.o. female who was seen today for physical therapy evaluation and treatment for right sided neck pain radiating into arm. Patient presenting with muscle spasms resulting in radicular symptoms and poor tolerance to today's interventions. Educated patient  on gentle motions, avoiding painful motions and breath work. Answered all questions and reassured patient during session. Patient would greatly benefit from skilled PT to improve overall function and QOL ?  ?  ?OBJECTIVE IMPAIRMENTS decreased activity tolerance, decreased ROM, decreased strength, increased muscle spasms, postural dysfunction, and pain.  ?  ?ACTIVITY LIMITATIONS cleaning, community activity, occupation, and shopping.  ?  ?PERSONAL FACTORS Time since onset of injury/illness/exacerbation and 1 comorbidity: history of neck pain  are also affecting patient's functional outcome.  ?  ?  ?REHAB POTENTIAL: Good ?  ?CLINICAL DECISION MAKING: Stable/uncomplicated ?  ?EVALUATION COMPLEXITY: Low ?  ?GOALS: ?Goals reviewed with patient?  yes ?  ?SHORT TERM GOALS: ?  ?Patient will be independent in self management strategies to improve quality of life and functional outcomes. ?Baseline: new program ?Target date: 01/20/2022 ?Goal status: INITIAL ?  ?2.  Patient will report at least 50% improvement in overall symptoms and/or function to demonstrate improved functional mobility ?Baseline: 0% ?Target date: 01/20/2022 ?Goal status: INITIAL ?  ?3.  Patient will be able to lay supine without pain to improve ability to participate in supine yoga poses ?Baseline: unable ?Target date: 01/20/2022 ?Goal status: INITIAL ?  ?  ?  ?  ?  ?LONG TERM GOALS: ?  ?Patient will report at least 75% improvement in overall symptoms and/or function to demonstrate improved functional mobility ?Baseline: 0% ?Target date: 02/10/2022 ?Goal status: INITIAL ?  ?2.  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  ?3.  Patient will be demonstrate painfree MMT ?Baseline: painful ?Target date: 02/10/2022 ?Goal status: INITIAL ?  ?  ?  ?  ?PLAN: ?PT FREQUENCY: 2x/week ?  ?PT DURATION: 6 weeks ?  ?PLANNED INTERVENTIONS: Therapeutic exercises,  Therapeutic activity, Neuromuscular re-education, Balance training, Gait training, Patient/Family education, Joint manipulation, Joint mobilization, Canalith repositioning, Dry Needling, Electrical stimulatio

## 2022-01-06 ENCOUNTER — Ambulatory Visit (INDEPENDENT_AMBULATORY_CARE_PROVIDER_SITE_OTHER): Payer: BC Managed Care – PPO | Admitting: Physical Therapy

## 2022-01-06 ENCOUNTER — Encounter: Payer: Self-pay | Admitting: Physical Therapy

## 2022-01-06 DIAGNOSIS — M6281 Muscle weakness (generalized): Secondary | ICD-10-CM | POA: Diagnosis not present

## 2022-01-06 DIAGNOSIS — M542 Cervicalgia: Secondary | ICD-10-CM

## 2022-01-06 NOTE — Therapy (Signed)
?OUTPATIENT PHYSICAL THERAPY TREATMENT NOTE ? ? ?Patient Name: Sherry Rodriguez ?MRN: [id number] ?DOB:Dec 19, 1985, 36 y.o., female ?Today's Date: 01/07/2022 ? ?END OF SESSION:  ? PT End of Session - 01/06/22 1601   ? ? Visit Number 3   ? Number of Visits 12   ? Date for PT Re-Evaluation 02/10/22   ? Authorization - Visit Number 3   ? Authorization - Number of Visits 30   ? Progress Note Due on Visit 10   ? PT Start Time 1603   ? PT Stop Time 1644   ? PT Time Calculation (min) 41 min   ? Activity Tolerance Patient limited by pain   ? Behavior During Therapy Restless;Anxious   ? ?  ?  ? ?  ? ? ?History reviewed. No pertinent past medical history. ?Past Surgical History:  ?Procedure Laterality Date  ? CESAREAN SECTION    ? ?Patient Active Problem List  ? Diagnosis Date Noted  ? Physical exam 10/13/2016  ? Left facial numbness 10/13/2016  ? 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 ?  ?  ?PCP: Sheliah Hatch ?  ?REFERRING PROVIDER: Rodolph Bong ?  ?REFERRING DIAG: M54.2 (ICD-10-CM) - Neck pain ?M50.10 (ICD-10-CM) - Cervical disc disorder with radiculopathy of cervical region ?  ?THERAPY DIAG:  ?Cervicalgia ?  ?Muscle weakness (generalized) ?  ?Pain in right arm ?  ?ONSET DATE: 4 weeks ago ?  ?SUBJECTIVE:                                                                                                                                                                                                        ?  ?SUBJECTIVE STATEMENT: ?01/07/2022 ?States she feels like she is kind of on the upswing of things. States that sees her PCP next week. States that she is still getting some pain towards the end of the end of the night. Massage helps. States that she is still avoiding supine and prone or table top or downdog. ? ?Eval: States about 4 weeks ago she woke up with neck and arm pain. States that she did a yogi stick up and had a little bit of pain the next day. States she has had 6 chiropractor adjustments  and 3 massages and nothing has helped. States she has been rolling around on a ennis ball. Always worse at night. 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 States that she now has flexoril which seems ot be helping. States that she got a new mattress in decemeber which seemed to helped it  for a little bit but now she is having pain. States she sleeps on her left side and supine with pillows propped up neck. She has tried heat/ice and nothing seems to be helping. ?  ?PERTINENT HISTORY:  ?hx of neck pain ?  ?PAIN:  ?Are you having pain? Yes: NPRS scale: 0/10 ?Pain location: right upper shoulder and radiating into right arm. ?Pain description: aching, lava poring down arm ?Aggravating factors: worse at night 8/10, weightbearing and laying flat on her back.  ?Relieving factors: nerve flossing ?  ?PRECAUTIONS: None ?  ?WEIGHT BEARING RESTRICTIONS No ?  ?FALLS:  ?Has patient fallen in last 6 months? No ?  ?  ?OCCUPATION: yoga instructor ?  ?PLOF: Independent ?  ?PATIENT GOALS to be able to have less pain and function ?  ?OBJECTIVE:  ?  ?POSTURE:  ?Forward head, rounded shoulders ?  ?PALPATION: ?Tenderness to palpation along anterior and lateral neck into right shoulder - increased resting tone noted throughout.     ?  ?CERVICAL ROM:  ?  ?Active ROM A/PROM (deg) ?12/30/2021  ?Flexion WFL*  ?Extension WFL*  ?Right lateral flexion WFL  ?Left lateral flexion WFL  ?Right rotation    ?Left rotation    ? (Blank rows = not tested) ?*pain ?  ?  ?           UE Measurements ?      ?Upper Extremity Right ?12/30/2021 Left ?12/30/2021  ?  A/PROM MMT A/PROM MMT  ?Shoulder Flexion WFL  4*   4+  ?Shoulder Extension          ?Shoulder Abduction   4*   4+  ?Shoulder Adduction          ?Shoulder Internal Rotation Reaches to L2 SP* 4+* Reaches to T12 SP 4+  ?Shoulder External Rotation Reaches to T4 SP* 4* Reaches to T4 SP* 4+  ?Elbow Flexion          ?Elbow Extension          ?Wrist Flexion          ?Wrist Extension          ?Wrist Supination           ?Wrist Pronation          ?Wrist Ulnar Deviation          ?Wrist Radial Deviation          ?Grip Strength NA   NA    ?                      (Blank rows = not tested) ?                      * pain ?  ?  ?CERVICAL SPECIAL TESTS:  ?Distraction - painful, Spurling - painful  ?  ?  ?Interventions ?01/07/2022 ?Therapeutic Exercise: ?Standing:  ?Back up against the wall posture holds - eased into - small motions - 10 mintues ?"high plank" at wall - rock back and forth 10 minutes ?Shoulder flexion stretch up wall x10  ? ? ?Previous interventions:  ?Therapeutic Exercise: ?           Quadruped:unable pain ?Supine:unable pain ?Prone: unable pain ?           Seated: long exhales breath work to reduce pain - 6 minutes  ?           tall kneeling with posterior wall support:small chin tucks  x15, breath work 8 minutes - diaphragmatic breathing  ?Neuromuscular Re-education: ?Manual Therapy: STM to right trap - left s/l 10 minutes, scapular mobility PROM with STM R in s/l 8 minutes  ?  ?  ?PATIENT EDUCATION:  ?Education details: on current presentation, on upcoming MD visit, on possible benefits of additional therapeutic inventions ?Person educated: Patient ?Education method: Explanation, Demonstration, and Handouts ?Education comprehension: verbalized understanding ?  ?  ?  ?HOME EXERCISE PROGRAM: ?Indirection motion, gentle massage, standing exercises at wall  ?  ?ASSESSMENT: ?  ?CLINICAL IMPRESSION: ?01/07/2022 ?Continued difficulties with tolerance to interventions and exercises. Educated patient on easing into motion, distracting self with focusing on other parts of the body and resting as needed. Though improvement in pain levels, patient is still limiting herself with most activities. Discussed current presentation but did not go into detail secondary to anxiety/fear of current presentation. Radicular symptoms noted with the slightest bit of cervical retraction and muscle pain that limits ability to perform most interventions  that would likely be helpful. Encouraged patient to discuss current functional limitations and pain presentations with MD at upcoming MD apt.  ? ?Eval: Patient is a 36 y.o. female who was seen today for physical therapy evaluation and treatment for right sided neck pain radiating into arm. Patient presenting with muscle spasms resulting in radicular symptoms and poor tolerance to today's interventions. Educated patient on gentle motions, avoiding painful motions and breath work. Answered all questions and reassured patient during session. Patient would greatly benefit from skilled PT to improve overall function and QOL ?  ?  ?OBJECTIVE IMPAIRMENTS decreased activity tolerance, decreased ROM, decreased strength, increased muscle spasms, postural dysfunction, and pain.  ?  ?ACTIVITY LIMITATIONS cleaning, community activity, occupation, and shopping.  ?  ?PERSONAL FACTORS Time since onset of injury/illness/exacerbation and 1 comorbidity: history of neck pain  are also affecting patient's functional outcome.  ?  ?  ?REHAB POTENTIAL: Good ?  ?CLINICAL DECISION MAKING: Stable/uncomplicated ?  ?EVALUATION COMPLEXITY: Low ?  ?GOALS: ?Goals reviewed with patient?  yes ?  ?SHORT TERM GOALS: ?  ?Patient will be independent in self management strategies to improve quality of life and functional outcomes. ?Baseline: new program ?Target date: 01/20/2022 ?Goal status: INITIAL ?  ?2.  Patient will report at least 50% improvement in overall symptoms and/or function to demonstrate improved functional mobility ?Baseline: 0% ?Target date: 01/20/2022 ?Goal status: INITIAL ?  ?3.  Patient will be able to lay supine without pain to improve ability to participate in supine yoga poses ?Baseline: unable ?Target date: 01/20/2022 ?Goal status: INITIAL ?  ?  ?  ?  ?  ?LONG TERM GOALS: ?  ?Patient will report at least 75% improvement in overall symptoms and/or function to demonstrate improved functional mobility ?Baseline: 0% ?Target date:  02/10/2022 ?Goal status: INITIAL ?  ?2.  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  ?3.  Pati

## 2022-01-08 NOTE — Progress Notes (Signed)
   I, Christoper Fabian, LAT, ATC, am serving as scribe for Dr. Clementeen Graham.  Sherry Rodriguez is a 36 y.o. female who presents to [facility] at [facility] today for f/u of R neck and shoulder pain thought to be due to cervical radiculopathy.  She was last seen by Dr. Denyse Amass on 12/11/21 and was referred to PT of which she's completed 3 visits.  She was also prescribed gabapentin, tizanidine and a short course of prednisone.  Today, pt reports some improvement in her pain. Pt c/o cont tingling through R arm to 1-2 fingers. Pt notes the pain worse in the evenings.  She is taking Flexeril in the evening which helps her sleep and helps to manage her symptoms well.   Pertinent review of systems: No fevers or chills  Relevant historical information: Otherwise healthy   Exam:  BP 104/76   Pulse 72   Ht 5' 9.5" (1.765 m)   Wt 152 lb (68.9 kg)   LMP 12/30/2021 (Exact Date)   SpO2 99%   BMI 22.12 kg/m  General: Well Developed, well nourished, and in no acute distress.   MSK: C-spine normal motion.  Upper extremity strength intact    Lab and Radiology Results  X-ray images C-spine obtained today personally and independently interpreted Loss of cervical lordosis indicating spasm.  Spondylosis at C4-5.  DDD at C5-6 and C7-T1 Await formal radiology review     Assessment and Plan: 36 y.o. female with right cervical radiculopathy at C6 and possibly C7 dermatomal pattern.  Patient has had some improvement with physical therapy but still having bothersome symptoms.  Plan to continue PT.  If not improved in 6 weeks would consider MRI for epidural steroid injection planning.  She will let me know.  Otherwise recheck in 2 months.   PDMP not reviewed this encounter. Orders Placed This Encounter  Procedures   DG Cervical Spine 2 or 3 views    Standing Status:   Future    Number of Occurrences:   1    Standing Expiration Date:   01/10/2023    Order Specific Question:   Reason for Exam  (SYMPTOM  OR DIAGNOSIS REQUIRED)    Answer:   cervical radiculopathy    Order Specific Question:   Is patient pregnant?    Answer:   No    Order Specific Question:   Preferred imaging location?    Answer:   [facility]   No orders of the defined types were placed in this encounter.    Discussed warning signs or symptoms. Please see discharge instructions. Patient expresses understanding.   The above documentation has been reviewed and is accurate and complete Clementeen Graham, M.D.

## 2022-01-09 ENCOUNTER — Ambulatory Visit (INDEPENDENT_AMBULATORY_CARE_PROVIDER_SITE_OTHER): Payer: BC Managed Care – PPO | Admitting: Physical Therapy

## 2022-01-09 ENCOUNTER — Ambulatory Visit (INDEPENDENT_AMBULATORY_CARE_PROVIDER_SITE_OTHER): Payer: BC Managed Care – PPO | Admitting: Family Medicine

## 2022-01-09 ENCOUNTER — Encounter: Payer: Self-pay | Admitting: Physical Therapy

## 2022-01-09 ENCOUNTER — Ambulatory Visit (INDEPENDENT_AMBULATORY_CARE_PROVIDER_SITE_OTHER): Payer: BC Managed Care – PPO

## 2022-01-09 VITALS — BP 104/76 | HR 72 | Ht 69.5 in | Wt 152.0 lb

## 2022-01-09 DIAGNOSIS — M5010 Cervical disc disorder with radiculopathy, unspecified cervical region: Secondary | ICD-10-CM

## 2022-01-09 DIAGNOSIS — M6281 Muscle weakness (generalized): Secondary | ICD-10-CM | POA: Diagnosis not present

## 2022-01-09 DIAGNOSIS — M542 Cervicalgia: Secondary | ICD-10-CM

## 2022-01-09 DIAGNOSIS — M5412 Radiculopathy, cervical region: Secondary | ICD-10-CM | POA: Diagnosis not present

## 2022-01-09 NOTE — Patient Instructions (Addendum)
Thank you for coming in today.   Please get an Xray today before you leave   Recheck back in 2 months  Send me an update in 2-3 weeks. If not better I can order a MRI

## 2022-01-09 NOTE — Therapy (Signed)
OUTPATIENT PHYSICAL THERAPY TREATMENT NOTE   Patient Name: Sherry Rodriguez MRN: [id number] DOB:[birth year], 36 y.o., female Today's Date: 01/09/2022  END OF SESSION:   PT End of Session - 01/09/22 1018     Visit Number 4    Number of Visits 12    Date for PT Re-Evaluation 02/10/22    Authorization - Visit Number 4    Authorization - Number of Visits 30    Progress Note Due on Visit 10    PT Start Time 1018    PT Stop Time 1058    PT Time Calculation (min) 40 min    Activity Tolerance Patient limited by pain    Behavior During Therapy Restless;Anxious             History reviewed. No pertinent past medical history. 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 10/13/2016       PCP: Sheliah Hatch   REFERRING PROVIDER: Rodolph Bong   REFERRING DIAG: M54.2 (ICD-10-CM) - Neck pain M50.10 (ICD-10-CM) - Cervical disc disorder with radiculopathy of cervical region   THERAPY DIAG:  Cervicalgia   Muscle weakness (generalized)   Pain in right arm   ONSET DATE: 4 weeks ago   SUBJECTIVE:                                                                                                                                                                                                          SUBJECTIVE STATEMENT: 01/09/2022 States that saw MD and he wants her touch base in 2-3 weeks.. Mornings are good. States that she is getting less pain and more movement. Cervical flexion helps with symptoms down arm. States she has tried downward facing dog, and is tolerating it better  Eval: States about 4 weeks ago she woke up with neck and arm pain. States that she did a yogi stick up and had a little bit of pain the next day. States she has had 6 chiropractor adjustments and 3 massages and  nothing has helped. States she has been rolling around on a ennis ball. Always worse at night. 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 States that she now has flexoril which seems ot be helping. States that she got a new mattress in decemeber which seemed to helped it for a little bit but now she is  having pain. States she sleeps on her left side and supine with pillows propped up neck. She has tried heat/ice and nothing seems to be helping.   PERTINENT HISTORY:  hx of neck pain   PAIN:  Are you having pain? Yes: NPRS scale: 1/10 Pain location:  right arm. Pain description: aching, lava poring down arm Aggravating factors: worse at night 8/10, weightbearing and laying flat on her back.  Relieving factors: nerve flossing   PRECAUTIONS: None   WEIGHT BEARING RESTRICTIONS No   FALLS:  Has patient fallen in last 6 months? No     OCCUPATION: yoga instructor   PLOF: Independent   PATIENT GOALS to be able to have less pain and function   OBJECTIVE:    POSTURE:  Forward head, rounded shoulders   PALPATION: Tenderness to palpation along anterior and lateral neck into right shoulder - increased resting tone noted throughout.       CERVICAL ROM:    Active ROM A/PROM (deg) 12/30/2021  Flexion WFL*  Extension WFL*  Right lateral flexion WFL  Left lateral flexion WFL  Right rotation    Left rotation     (Blank rows = not tested) *pain                UE Measurements       Upper Extremity Right 12/30/2021 Left 12/30/2021    A/PROM MMT A/PROM MMT  Shoulder Flexion WFL  4*   4+  Shoulder Extension          Shoulder Abduction   4*   4+  Shoulder Adduction          Shoulder Internal Rotation Reaches to L2 SP* 4+* Reaches to T12 SP 4+  Shoulder External Rotation Reaches to T4 SP* 4* Reaches to T4 SP* 4+  Elbow Flexion          Elbow Extension          Wrist Flexion          Wrist Extension          Wrist Supination          Wrist Pronation          Wrist Ulnar Deviation           Wrist Radial Deviation          Grip Strength NA   NA                          (Blank rows = not tested)                       * pain     CERVICAL SPECIAL TESTS:  Distraction - painful, Spurling - painful      Interventions 01/09/2022 Therapeutic Exercise:           Standing: back at wall SCAPULAR Protraction 3x10 5" holds no resistance, then with Red band x15 5" holds with inhale  Neuromuscular Re-education: Posture series at wall - forward facing wall laying "prone" and table top at wall - verbal, tactile cues, video demonstration posture with cues (Pt agreed to be video recorded on her phone) 30 minutes   Previous interventions:  Therapeutic Exercise:            Quadruped:unable pain Supine:unable pain Prone: unable pain    PATIENT EDUCATION:  Education details: on current presentation, posture with pictures on patient's phone (pt consented) Person educated: Patient Education  method: Explanation, Demonstration, and Handouts Education comprehension: verbalized understanding       HOME EXERCISE PROGRAM: Indirection motion, gentle massage, standing exercises at wall    ASSESSMENT:   CLINICAL IMPRESSION: 01/09/2022 Patient continues to improve in tolerance to interventions. Improved active posture and utilized pictures to show patient. Focused on standing exercises secondary to tolerance to this position. Slight increase in soreness noted end of session. Added standing exercises to HEP and encouraged patient to continue to trial positions/exercises as body tolerates/allows.  Eval: Patient is a 36 y.o. female who was seen today for physical therapy evaluation and treatment for right sided neck pain radiating into arm. Patient presenting with muscle spasms resulting in radicular symptoms and poor tolerance to today's interventions. Educated patient on gentle motions, avoiding painful motions and breath work. Answered all questions and reassured patient during session. Patient  would greatly benefit from skilled PT to improve overall function and QOL     OBJECTIVE IMPAIRMENTS decreased activity tolerance, decreased ROM, decreased strength, increased muscle spasms, postural dysfunction, and pain.    ACTIVITY LIMITATIONS cleaning, community activity, occupation, and shopping.    PERSONAL FACTORS Time since onset of injury/illness/exacerbation and 1 comorbidity: history of neck pain  are also affecting patient's functional outcome.      REHAB POTENTIAL: Good   CLINICAL DECISION MAKING: Stable/uncomplicated   EVALUATION COMPLEXITY: Low   GOALS: Goals reviewed with patient?  yes   SHORT TERM GOALS:   Patient will be independent in self management strategies to improve quality of life and functional outcomes. Baseline: new program Target date: 01/20/2022 Goal status: INITIAL   2.  Patient will report at least 50% improvement in overall symptoms and/or function to demonstrate improved functional mobility Baseline: 0% Target date: 01/20/2022 Goal status: INITIAL   3.  Patient will be able to lay supine without pain to improve ability to participate in supine yoga poses Baseline: unable Target date: 01/20/2022 Goal status: INITIAL           LONG TERM GOALS:   Patient will report at least 75% improvement in overall symptoms and/or function to demonstrate improved functional mobility Baseline: 0% Target date: 02/10/2022 Goal status: INITIAL   2.  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  Patient will be demonstrate painfree MMT Baseline: painful Target date: 02/10/2022 Goal status: INITIAL         PLAN: PT FREQUENCY: 2x/week   PT DURATION: 6 weeks   PLANNED INTERVENTIONS: Therapeutic exercises, Therapeutic activity, Neuromuscular re-education, Balance training, Gait training, Patient/Family education, Joint manipulation, Joint  mobilization, Canalith repositioning, Dry Needling, Electrical stimulation, Spinal manipulation, Spinal mobilization, Cryotherapy, Moist heat, Ionotophoresis 4mg /ml Dexamethasone, and Manual therapy   PLAN FOR NEXT SESSION: standing posture series, gentle STM, DN?, estim, gentle ROM, indirection motions/STM   12:59 PM, 01/09/22 Tereasa CoopMichele Cherylynn Liszewski, DPT Physical Therapy with [facility]

## 2022-01-10 NOTE — Progress Notes (Signed)
Cervical spine x-ray shows a tiny little bit of arthritis changes but otherwise looks okay to radiology.

## 2022-01-13 ENCOUNTER — Encounter: Payer: Self-pay | Admitting: Family Medicine

## 2022-01-13 ENCOUNTER — Ambulatory Visit (INDEPENDENT_AMBULATORY_CARE_PROVIDER_SITE_OTHER): Payer: BC Managed Care – PPO | Admitting: Physical Therapy

## 2022-01-13 ENCOUNTER — Ambulatory Visit (INDEPENDENT_AMBULATORY_CARE_PROVIDER_SITE_OTHER): Payer: BC Managed Care – PPO | Admitting: Family Medicine

## 2022-01-13 ENCOUNTER — Encounter: Payer: Self-pay | Admitting: Physical Therapy

## 2022-01-13 VITALS — BP 124/74 | HR 63 | Temp 99.1°F | Resp 17 | Ht 69.5 in | Wt 155.8 lb

## 2022-01-13 DIAGNOSIS — R221 Localized swelling, mass and lump, neck: Secondary | ICD-10-CM

## 2022-01-13 DIAGNOSIS — F419 Anxiety disorder, unspecified: Secondary | ICD-10-CM

## 2022-01-13 DIAGNOSIS — M6281 Muscle weakness (generalized): Secondary | ICD-10-CM

## 2022-01-13 DIAGNOSIS — M542 Cervicalgia: Secondary | ICD-10-CM | POA: Diagnosis not present

## 2022-01-13 LAB — CBC WITH DIFFERENTIAL/PLATELET
Basophils Absolute: 0.1 10*3/uL (ref 0.0–0.1)
Basophils Relative: 1 % (ref 0.0–3.0)
Eosinophils Absolute: 0.1 10*3/uL (ref 0.0–0.7)
Eosinophils Relative: 1 % (ref 0.0–5.0)
HCT: 37.5 % (ref 36.0–46.0)
Hemoglobin: 12.6 g/dL (ref 12.0–15.0)
Lymphocytes Relative: 17.5 % (ref 12.0–46.0)
Lymphs Abs: 1.2 10*3/uL (ref 0.7–4.0)
MCHC: 33.6 g/dL (ref 30.0–36.0)
MCV: 90.8 fl (ref 78.0–100.0)
Monocytes Absolute: 0.6 10*3/uL (ref 0.1–1.0)
Monocytes Relative: 9 % (ref 3.0–12.0)
Neutro Abs: 5.1 10*3/uL (ref 1.4–7.7)
Neutrophils Relative %: 71.5 % (ref 43.0–77.0)
Platelets: 276 10*3/uL (ref 150.0–400.0)
RBC: 4.13 Mil/uL (ref 3.87–5.11)
RDW: 14 % (ref 11.5–15.5)
WBC: 7.1 10*3/uL (ref 4.0–10.5)

## 2022-01-13 LAB — T3, FREE: T3, Free: 3.4 pg/mL (ref 2.3–4.2)

## 2022-01-13 LAB — T4, FREE: Free T4: 0.61 ng/dL (ref 0.60–1.60)

## 2022-01-13 LAB — TSH: TSH: 1.84 u[IU]/mL (ref 0.35–5.50)

## 2022-01-13 NOTE — Therapy (Signed)
OUTPATIENT PHYSICAL THERAPY TREATMENT NOTE   Patient Name: Sherry Rodriguez MRN: [id number] DOB:[birth year], 36 y.o., female Today's Date: 01/13/2022  END OF SESSION:   PT End of Session - 01/13/22 0802     Visit Number 5    Number of Visits 12    Date for PT Re-Evaluation 02/10/22    Authorization - Visit Number 5    Authorization - Number of Visits 30    Progress Note Due on Visit 10    PT Start Time 0803    PT Stop Time 0845    PT Time Calculation (min) 42 min    Activity Tolerance Patient limited by pain    Behavior During Therapy Restless             History reviewed. No pertinent past medical history. 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 States that yesterday was the first time she didn't have pain reaching behind her back and had a massage each day.   Eval: States about 4 weeks ago she woke up with neck and arm pain. States that she did a yogi stick up and had a little bit of pain the next day. States she has had 6 chiropractor adjustments and 3 massages and nothing has helped. States she has been rolling around on a ennis ball. Always worse at night. 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 States that she now has flexoril which seems ot be helping. States that she got a new mattress in decemeber which seemed to helped it for a little bit but now she is having pain. States she sleeps on her left side and supine with pillows propped up neck.  She has tried heat/ice and nothing seems to be helping.   PERTINENT HISTORY:  hx of neck pain   PAIN:  Are you having pain? no: NPRS scale: 0/10 Pain location:  right arm. Pain description: aching, lava poring down arm Aggravating factors: worse at night 8/10, weightbearing and laying flat on her back.  Relieving factors: nerve flossing   PRECAUTIONS: None   WEIGHT BEARING RESTRICTIONS No   FALLS:  Has patient fallen in last 6 months? No     OCCUPATION: yoga instructor   PLOF: Independent   PATIENT GOALS to be able to have less pain and function   OBJECTIVE:    POSTURE:  Forward head, rounded shoulders   PALPATION: Tenderness to palpation along anterior and lateral neck into right shoulder - increased resting tone noted throughout.       CERVICAL ROM:    Active ROM A/PROM (deg) 12/30/2021  Flexion WFL*  Extension WFL*  Right lateral flexion WFL  Left lateral flexion WFL  Right rotation    Left rotation     (Blank rows = not tested) *pain                UE Measurements       Upper Extremity Right 12/30/2021 Left 12/30/2021    A/PROM MMT A/PROM MMT  Shoulder Flexion WFL  4*   4+  Shoulder Extension          Shoulder Abduction   4*   4+  Shoulder Adduction          Shoulder Internal Rotation Reaches to L2 SP* 4+* Reaches to T12 SP 4+  Shoulder External Rotation Reaches to T4 SP* 4* Reaches to T4 SP* 4+  Elbow Flexion          Elbow Extension          Wrist Flexion          Wrist Extension          Wrist Supination          Wrist Pronation          Wrist Ulnar Deviation          Wrist Radial Deviation          Grip Strength NA   NA                          (Blank rows =  not tested)                       * pain     CERVICAL SPECIAL TESTS:  Distraction - painful, Spurling - painful      Interventions 01/13/2022  Neuromuscular Re-education: on downdog and posture with muscle activation - practice and prior demo- 40 minutes total - tactile and verbal cues - used patient's phone with their consent to record and use as feedback  Previous interventions:  Therapeutic Exercise:            Quadruped:unable pain Supine:unable pain Prone: unable pain    PATIENT EDUCATION:  Education details: on current presentation, posture with pictures on patient's phone (pt consented) Person educated: Patient Education method: Explanation, Demonstration, and Handouts Education comprehension: verbalized understanding       HOME EXERCISE PROGRAM: Indirection motion, gentle massage, standing exercises at wall    ASSESSMENT:   CLINICAL IMPRESSION: 01/13/2022 Session focused on muscle activation in extremes and neutral positions. Educated patient on typical posture and  optimal posture. Answered all questions. Focus was on down dog position and improving muscle activation in this position. Suggested added blocks under forefoot to elevate heels os it would be difficult to ""fall" into positions. Will continue with current POC as tolerated.   Eval: Patient is a 36 y.o. female who was seen today for physical therapy evaluation and treatment for right sided neck pain radiating into arm. Patient presenting with muscle spasms resulting in radicular symptoms and poor tolerance to today's interventions. Educated patient on gentle motions, avoiding painful motions and breath work. Answered all questions and reassured patient during session. Patient would greatly benefit from skilled PT to improve overall function and QOL     OBJECTIVE IMPAIRMENTS decreased activity tolerance, decreased ROM, decreased strength, increased muscle spasms, postural dysfunction, and pain.    ACTIVITY  LIMITATIONS cleaning, community activity, occupation, and shopping.    PERSONAL FACTORS Time since onset of injury/illness/exacerbation and 1 comorbidity: history of neck pain  are also affecting patient's functional outcome.      REHAB POTENTIAL: Good   CLINICAL DECISION MAKING: Stable/uncomplicated   EVALUATION COMPLEXITY: Low   GOALS: Goals reviewed with patient?  yes   SHORT TERM GOALS:   Patient will be independent in self management strategies to improve quality of life and functional outcomes. Baseline: new program Target date: 01/20/2022 Goal status: INITIAL   2.  Patient will report at least 50% improvement in overall symptoms and/or function to demonstrate improved functional mobility Baseline: 0% Target date: 01/20/2022 Goal status: INITIAL   3.  Patient will be able to lay supine without pain to improve ability to participate in supine yoga poses Baseline: unable Target date: 01/20/2022 Goal status: INITIAL           LONG TERM GOALS:   Patient will report at least 75% improvement in overall symptoms and/or function to demonstrate improved functional mobility Baseline: 0% Target date: 02/10/2022 Goal status: INITIAL   2.  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  Patient will be demonstrate painfree MMT Baseline: painful Target date: 02/10/2022 Goal status: INITIAL         PLAN: PT FREQUENCY: 2x/week   PT DURATION: 6 weeks   PLANNED INTERVENTIONS: Therapeutic exercises, Therapeutic activity, Neuromuscular re-education, Balance training, Gait training, Patient/Family education, Joint manipulation, Joint mobilization, Canalith repositioning, Dry Needling, Electrical stimulation, Spinal manipulation, Spinal mobilization, Cryotherapy, Moist heat, Ionotophoresis 4mg /ml Dexamethasone, and Manual therapy   PLAN FOR NEXT SESSION: standing  posture series, gentle STM, DN?, estim, gentle ROM, indirection motions/STM   8:54 AM, 01/13/22 Jerene Pitch, DPT Physical Therapy with [facility]

## 2022-01-13 NOTE — Assessment & Plan Note (Signed)
New.  This is different from a globus sensation.  This is a palpable mass just L of midline of just under chin.  Seems high to be thyroid but that is definitely a possibility.  Get thyroid labs to assess function.  Get [place] of neck to assess mass.  Pt expressed understanding and is in agreement w/ plan.

## 2022-01-13 NOTE — Progress Notes (Signed)
   Subjective:    Patient ID: Sherry Rodriguez, female    DOB: 12/23/1985, 36 y.o.   MRN: [id number]  HPI Stress/Anxiety- pt reports she has had ongoing neck problems and she is currently in PT.  Was told that her neck problems may have come from stress.  Pt doesn't feel that she is under enough stress to take a daily medication.  Feels that she's 'on the upswing' as neck pain improves.  Would like to talk about which medication would be recommended if she decided she wanted to take something.  Lump L neck- was seen previously and told the difficulty swallowing was globus and caused by stress.  However, there is an actual physical lump that is visible- particularly when swallowing.  Lump can be sensitive when touched.  Is unchanged in size from when first noticed.   Review of Systems For ROS see HPI     Objective:   Physical Exam Vitals reviewed.  Constitutional:      General: She is not in acute distress.    Appearance: Normal appearance. She is not ill-appearing.  HENT:     Head: Normocephalic and atraumatic.  Neck:   Musculoskeletal:     Cervical back: Normal range of motion.  Lymphadenopathy:     Cervical: No cervical adenopathy.     Right cervical: No superficial or posterior cervical adenopathy.    Left cervical: No superficial or posterior cervical adenopathy.  Skin:    General: Skin is warm and dry.  Neurological:     General: No focal deficit present.     Mental Status: She is alert and oriented to person, place, and time.  Psychiatric:        Mood and Affect: Mood normal.        Behavior: Behavior normal.        Thought Content: Thought content normal.          Assessment & Plan:

## 2022-01-13 NOTE — Patient Instructions (Addendum)
Follow up as needed or as scheduled We'll notify you of your ultrasound results ASAP I think your anxiety level is doing great and I don't think you need a daily medication- but please let me know if that changes! Call with any questions or concerns Hang in there!!!

## 2022-01-13 NOTE — Assessment & Plan Note (Signed)
Pt has hx of similar but has been well managed w/ very sparing use of Alprazolam.  Was told that some of her neck pain and her globus sensation can be triggered by anxiety.  She feels like things are going well and she doesn't require a daily medication at this time.  I agree with her.  Discussed possibility of Sertraline were we to need a medication in the future as Celexa caused Anorgasmia.

## 2022-01-14 ENCOUNTER — Telehealth: Payer: Self-pay

## 2022-01-14 NOTE — Telephone Encounter (Signed)
Spoke w/ pt and advised her of the lab results .

## 2022-01-14 NOTE — Telephone Encounter (Signed)
-----   Message from Sheliah Hatch, MD sent at 01/14/2022  7:40 AM EDT ----- Labs look great!  Thyroid function is normal

## 2022-01-15 ENCOUNTER — Encounter: Payer: Self-pay | Admitting: Family Medicine

## 2022-01-16 ENCOUNTER — Ambulatory Visit (INDEPENDENT_AMBULATORY_CARE_PROVIDER_SITE_OTHER): Payer: BC Managed Care – PPO | Admitting: Physical Therapy

## 2022-01-16 ENCOUNTER — Encounter: Payer: Self-pay | Admitting: Physical Therapy

## 2022-01-16 ENCOUNTER — Ambulatory Visit (HOSPITAL_BASED_OUTPATIENT_CLINIC_OR_DEPARTMENT_OTHER): Payer: BC Managed Care – PPO

## 2022-01-16 DIAGNOSIS — M6281 Muscle weakness (generalized): Secondary | ICD-10-CM

## 2022-01-16 DIAGNOSIS — M542 Cervicalgia: Secondary | ICD-10-CM | POA: Diagnosis not present

## 2022-01-16 NOTE — Therapy (Signed)
OUTPATIENT PHYSICAL THERAPY TREATMENT NOTE   Patient Name: Sherry Rodriguez MRN: [id number] DOB:12/14/1985, 36 y.o., female Today's Date: 01/16/2022  END OF SESSION:   PT End of Session - 01/16/22 0931     Visit Number 6    Number of Visits 12    Date for PT Re-Evaluation 02/10/22    Authorization - Visit Number 6    Authorization - Number of Visits 30    Progress Note Due on Visit 10    PT Start Time 0931    PT Stop Time 1012    PT Time Calculation (min) 41 min    Activity Tolerance Patient limited by pain    Behavior During Therapy Restless             History reviewed. No pertinent past medical history. 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/13/2016       PCP: Sheliah HatchKatherine E Tabori   REFERRING PROVIDER: Rodolph Bongorey, Evan S   REFERRING DIAG: M54.2 (ICD-10-CM) - Neck pain M50.10 (ICD-10-CM) - Cervical disc disorder with radiculopathy of cervical region   THERAPY DIAG:  Cervicalgia   Muscle weakness (generalized)   Pain in right arm   ONSET DATE: 4 weeks ago   SUBJECTIVE:                                                                                                                                                                                                          SUBJECTIVE STATEMENT: 01/16/2022 States she is feeling good and not currently laying flat. Still more pain at night but reducing medications. Sta  Eval: States about 4 weeks ago she woke up with neck and arm pain. States that she did a yogi stick up and had a little bit of pain the next day. States she has had 6 chiropractor adjustments and 3 massages and nothing has helped. States she has been rolling around on a ennis ball. Always worse at night. 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 States that she now has flexoril which seems ot be helping. States that she got a new mattress in decemeber which seemed to helped it for a little bit but now she is having pain. States she sleeps on her left side and supine with pillows propped up neck. She  has tried heat/ice and nothing seems to be helping.   PERTINENT HISTORY:  hx of neck pain   PAIN:  Are you having pain? no: NPRS scale: 1/10 Pain location:  neck Pain description: aching, lava poring down arm Aggravating factors: worse at night 8/10, weightbearing and laying flat on her back.  Relieving factors: nerve flossing   PRECAUTIONS: None   WEIGHT BEARING RESTRICTIONS No   FALLS:  Has patient fallen in last 6 months? No     OCCUPATION: yoga instructor   PLOF: Independent   PATIENT GOALS to be able to have less pain and function   OBJECTIVE:    POSTURE:  Forward head, rounded shoulders   PALPATION: Tenderness to palpation along anterior and lateral neck into right shoulder - increased resting tone noted throughout.       CERVICAL ROM:    Active ROM A/PROM (deg) 12/30/2021  Flexion WFL*  Extension WFL*  Right lateral flexion WFL  Left lateral flexion WFL  Right rotation    Left rotation     (Blank rows = not tested) *pain                UE Measurements       Upper Extremity Right 12/30/2021 Left 12/30/2021    A/PROM MMT A/PROM MMT  Shoulder Flexion WFL  4*   4+  Shoulder Extension          Shoulder Abduction   4*   4+  Shoulder Adduction          Shoulder Internal Rotation Reaches to L2 SP* 4+* Reaches to T12 SP 4+  Shoulder External Rotation Reaches to T4 SP* 4* Reaches to T4 SP* 4+  Elbow Flexion          Elbow Extension          Wrist Flexion          Wrist Extension          Wrist Supination          Wrist Pronation          Wrist Ulnar Deviation          Wrist Radial Deviation          Grip Strength NA   NA                          (Blank rows = not  tested)                       * pain     CERVICAL SPECIAL TESTS:  Distraction - painful, Spurling - painful      Interventions 01/16/2022 Therapeutic Exercise:            Quadruped:unable pain - table top with and without ball-6 minutes  Neuromuscular Re-education: back up against wall and breathing into posterior ribs, with posture/chin tucks and scap protraction - 10 minutes  Manual Therapy- Gentle cervical traction with head on 2 pillows - not tolerated well, STM to cervical musculature, gentle AP and medial glide of cervical spine R grade II- not tolerated well - AP of ribs focus on upper ribs and right side - stretching of posterior shoulder Grade II/III - cupping to right shoulder - 6 minutes   Previous interventions:   Supine:unable pain Prone: unable pain    PATIENT EDUCATION:  Education details: on cupping and risks/goals of intervention Person educated: Patient Education method: Explanation, Demonstration, and  Handouts Education comprehension: verbalized understanding       HOME EXERCISE PROGRAM: Indirection motion, gentle massage, standing exercises at wall    ASSESSMENT:   CLINICAL IMPRESSION: 01/16/2022  Patient continues to have poor tolerance to cervical motions and holding head up against gravity. Trailed different positions and postures today with increase in arms symptoms. Symptoms seem to be stemming from cervical spine but only briefly discussed this with patient secondary to history of anxiety. Will continue with current POC as tolerated   Eval: Patient is a 36 y.o. female who was seen today for physical therapy evaluation and treatment for right sided neck pain radiating into arm. Patient presenting with muscle spasms resulting in radicular symptoms and poor tolerance to today's interventions. Educated patient on gentle motions, avoiding painful motions and breath work. Answered all questions and reassured patient during session. Patient would greatly  benefit from skilled PT to improve overall function and QOL     OBJECTIVE IMPAIRMENTS decreased activity tolerance, decreased ROM, decreased strength, increased muscle spasms, postural dysfunction, and pain.    ACTIVITY LIMITATIONS cleaning, community activity, occupation, and shopping.    PERSONAL FACTORS Time since onset of injury/illness/exacerbation and 1 comorbidity: history of neck pain  are also affecting patient's functional outcome.      REHAB POTENTIAL: Good   CLINICAL DECISION MAKING: Stable/uncomplicated   EVALUATION COMPLEXITY: Low   GOALS: Goals reviewed with patient?  yes   SHORT TERM GOALS:   Patient will be independent in self management strategies to improve quality of life and functional outcomes. Baseline: new program Target date: 01/20/2022 Goal status: INITIAL   2.  Patient will report at least 50% improvement in overall symptoms and/or function to demonstrate improved functional mobility Baseline: 0% Target date: 01/20/2022 Goal status: INITIAL   3.  Patient will be able to lay supine without pain to improve ability to participate in supine yoga poses Baseline: unable Target date: 01/20/2022 Goal status: INITIAL           LONG TERM GOALS:   Patient will report at least 75% improvement in overall symptoms and/or function to demonstrate improved functional mobility Baseline: 0% Target date: 02/10/2022 Goal status: INITIAL   2.  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  Patient will be demonstrate painfree MMT Baseline: painful Target date: 02/10/2022 Goal status: INITIAL         PLAN: PT FREQUENCY: 2x/week   PT DURATION: 6 weeks   PLANNED INTERVENTIONS: Therapeutic exercises, Therapeutic activity, Neuromuscular re-education, Balance training, Gait training, Patient/Family education, Joint manipulation, Joint mobilization,  Canalith repositioning, Dry Needling, Electrical stimulation, Spinal manipulation, Spinal mobilization, Cryotherapy, Moist heat, Ionotophoresis 4mg /ml Dexamethasone, and Manual therapy   PLAN FOR NEXT SESSION: standing posture series, gentle STM, DN?, estim, gentle ROM, indirection motions/STM   10:59 AM, 01/16/22 01/18/22, DPT Physical Therapy with [facility]

## 2022-01-17 ENCOUNTER — Other Ambulatory Visit: Payer: Self-pay

## 2022-01-17 NOTE — Telephone Encounter (Signed)
Can you look at this looks like you had mentioned [facility] and pt is talking about [facility].

## 2022-01-17 NOTE — Telephone Encounter (Signed)
This has been schedule and location has been changed [facility]

## 2022-01-17 NOTE — Addendum Note (Signed)
Addended by: Veneda Melter on: 01/17/2022 11:57 AM   Modules accepted: Orders

## 2022-01-21 ENCOUNTER — Ambulatory Visit (INDEPENDENT_AMBULATORY_CARE_PROVIDER_SITE_OTHER): Payer: BC Managed Care – PPO | Admitting: Physical Therapy

## 2022-01-21 ENCOUNTER — Encounter: Payer: Self-pay | Admitting: Physical Therapy

## 2022-01-21 DIAGNOSIS — M6281 Muscle weakness (generalized): Secondary | ICD-10-CM | POA: Diagnosis not present

## 2022-01-21 DIAGNOSIS — M542 Cervicalgia: Secondary | ICD-10-CM | POA: Diagnosis not present

## 2022-01-21 NOTE — Therapy (Signed)
OUTPATIENT PHYSICAL THERAPY TREATMENT NOTE   Patient Name: Sherry Rodriguez MRN: [id number] DOB:01/26/1986, 36 y.o., female Today's Date: 01/21/2022  END OF SESSION:   PT End of Session - 01/21/22 1216     Visit Number 7    Number of Visits 12    Date for PT Re-Evaluation 02/10/22    Authorization - Visit Number 7    Authorization - Number of Visits 30    Progress Note Due on Visit 10    PT Start Time 1216    PT Stop Time 1256    PT Time Calculation (min) 40 min    Activity Tolerance Patient limited by pain    Behavior During Therapy Restless             History reviewed. No pertinent past medical history. 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/13/2016       PCP: Sheliah HatchKatherine E Tabori   REFERRING PROVIDER: Rodolph Bongorey, Evan S   REFERRING DIAG: M54.2 (ICD-10-CM) - Neck pain M50.10 (ICD-10-CM) - Cervical disc disorder with radiculopathy of cervical region   THERAPY DIAG:  Cervicalgia   Muscle weakness (generalized)   Pain in right arm   ONSET DATE: 4 weeks ago   SUBJECTIVE:                                                                                                                                                                                                          SUBJECTIVE STATEMENT: 01/21/2022 States she is feeling good and not currently laying flat. Still more pain at night but reducing medications. Sta  Eval: States about 4 weeks ago she woke up with neck and arm pain. States that she did a yogi stick up and had a little bit of pain the next day. States she has had 6 chiropractor adjustments and 3 massages and nothing has helped. States she has been rolling around on a ennis ball. Always worse at night. 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 States that she now has flexoril which seems ot be helping. States that she got a new mattress in decemeber which seemed to helped it for a little bit but now she is having pain. States she sleeps on her left side and supine with pillows propped up neck. She  has tried heat/ice and nothing seems to be helping.   PERTINENT HISTORY:  hx of neck pain   PAIN:  Are you having pain? no: NPRS scale: 1/10 Pain location:  neck Pain description: aching, lava poring down arm Aggravating factors: worse at night 8/10, weightbearing and laying flat on her back.  Relieving factors: nerve flossing   PRECAUTIONS: None   WEIGHT BEARING RESTRICTIONS No   FALLS:  Has patient fallen in last 6 months? No     OCCUPATION: yoga instructor   PLOF: Independent   PATIENT GOALS to be able to have less pain and function   OBJECTIVE:    POSTURE:  Forward head, rounded shoulders   PALPATION: Tenderness to palpation along anterior and lateral neck into right shoulder - increased resting tone noted throughout.       CERVICAL ROM:    Active ROM A/PROM (deg) 12/30/2021  Flexion WFL*  Extension WFL*  Right lateral flexion WFL  Left lateral flexion WFL  Right rotation    Left rotation     (Blank rows = not tested) *pain                UE Measurements       Upper Extremity Right 12/30/2021 Left 12/30/2021    A/PROM MMT A/PROM MMT  Shoulder Flexion WFL  4*   4+  Shoulder Extension          Shoulder Abduction   4*   4+  Shoulder Adduction          Shoulder Internal Rotation Reaches to L2 SP* 4+* Reaches to T12 SP 4+  Shoulder External Rotation Reaches to T4 SP* 4* Reaches to T4 SP* 4+  Elbow Flexion          Elbow Extension          Wrist Flexion          Wrist Extension          Wrist Supination          Wrist Pronation          Wrist Ulnar Deviation          Wrist Radial Deviation          Grip Strength NA   NA                          (Blank rows = not  tested)                       * pain     CERVICAL SPECIAL TESTS:  Distraction - painful, Spurling - painful      Interventions 01/21/2022 Therapeutic Exercise:            Prone: breathing 6 minutes, UE overhead 6  minutes PT assist  Standing: wall lift offs 6 minutes, wall protraction with flexion 8 minutes, at wall with weight in left hand posture with trunk side bends B 6 minutes   Seated breath work 3 minutes x2      PATIENT EDUCATION:  Education details: on current presentation and progression of PT and interventions Person educated: Patient Education method: Programmer, multimedia, Facilities manager, and Handouts Education comprehension: verbalized understanding       HOME EXERCISE PROGRAM: Indirection motion, gentle massage, standing exercises at wall    ASSESSMENT:   CLINICAL IMPRESSION: 01/21/2022 Improved tolerance noted today. Patient able to lay prone for a while without symptoms in arm. Discussed progress  made and to continue to progress PT as tolerated. Slight irritation of right shoulder but no pain. Fatigue noted with exercises.    Eval: Patient is a 36 y.o. female who was seen today for physical therapy evaluation and treatment for right sided neck pain radiating into arm. Patient presenting with muscle spasms resulting in radicular symptoms and poor tolerance to today's interventions. Educated patient on gentle motions, avoiding painful motions and breath work. Answered all questions and reassured patient during session. Patient would greatly benefit from skilled PT to improve overall function and QOL     OBJECTIVE IMPAIRMENTS decreased activity tolerance, decreased ROM, decreased strength, increased muscle spasms, postural dysfunction, and pain.    ACTIVITY LIMITATIONS cleaning, community activity, occupation, and shopping.    PERSONAL FACTORS Time since onset of injury/illness/exacerbation and 1 comorbidity: history of neck pain  are also affecting patient's functional  outcome.      REHAB POTENTIAL: Good   CLINICAL DECISION MAKING: Stable/uncomplicated   EVALUATION COMPLEXITY: Low   GOALS: Goals reviewed with patient?  yes   SHORT TERM GOALS:   Patient will be independent in self management strategies to improve quality of life and functional outcomes. Baseline: new program Target date: 01/20/2022 Goal status: INITIAL   2.  Patient will report at least 50% improvement in overall symptoms and/or function to demonstrate improved functional mobility Baseline: 0% Target date: 01/20/2022 Goal status: INITIAL   3.  Patient will be able to lay supine without pain to improve ability to participate in supine yoga poses Baseline: unable Target date: 01/20/2022 Goal status: INITIAL           LONG TERM GOALS:   Patient will report at least 75% improvement in overall symptoms and/or function to demonstrate improved functional mobility Baseline: 0% Target date: 02/10/2022 Goal status: INITIAL   2.  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  Patient will be demonstrate painfree MMT Baseline: painful Target date: 02/10/2022 Goal status: INITIAL         PLAN: PT FREQUENCY: 2x/week   PT DURATION: 6 weeks   PLANNED INTERVENTIONS: Therapeutic exercises, Therapeutic activity, Neuromuscular re-education, Balance training, Gait training, Patient/Family education, Joint manipulation, Joint mobilization, Canalith repositioning, Dry Needling, Electrical stimulation, Spinal manipulation, Spinal mobilization, Cryotherapy, Moist heat, Ionotophoresis 4mg /ml Dexamethasone, and Manual therapy   PLAN FOR NEXT SESSION: down down video    1:14 PM, 01/21/22 01/23/22, DPT Physical Therapy with [facility]

## 2022-01-22 ENCOUNTER — Ambulatory Visit
Admission: RE | Admit: 2022-01-22 | Discharge: 2022-01-22 | Disposition: A | Discharge status: Home / Self Care | Payer: BC Managed Care – PPO | Source: Ambulatory Visit | Attending: Family Medicine | Admitting: Family Medicine

## 2022-01-22 DIAGNOSIS — R221 Localized swelling, mass and lump, neck: Secondary | ICD-10-CM

## 2022-01-23 ENCOUNTER — Encounter: Payer: BC Managed Care – PPO | Admitting: Physical Therapy

## 2022-01-23 NOTE — Addendum Note (Signed)
Addended by: Sheliah Hatch on: 01/23/2022 12:18 PM   Modules accepted: Orders

## 2022-01-27 ENCOUNTER — Encounter: Payer: Self-pay | Admitting: Physical Therapy

## 2022-01-27 ENCOUNTER — Ambulatory Visit (INDEPENDENT_AMBULATORY_CARE_PROVIDER_SITE_OTHER): Payer: BC Managed Care – PPO | Admitting: Physical Therapy

## 2022-01-27 DIAGNOSIS — M6281 Muscle weakness (generalized): Secondary | ICD-10-CM | POA: Diagnosis not present

## 2022-01-27 DIAGNOSIS — M542 Cervicalgia: Secondary | ICD-10-CM

## 2022-01-27 NOTE — Therapy (Signed)
OUTPATIENT PHYSICAL THERAPY TREATMENT NOTE   Patient Name: Sherry Rodriguez MRN: [id number] DOB:1986/04/19, 36 y.o., female Today's Date: 01/27/2022  END OF SESSION:   PT End of Session - 01/27/22 1513     Visit Number 8    Number of Visits 12    Date for PT Re-Evaluation 02/10/22    Authorization - Visit Number 8    Authorization - Number of Visits 30    Progress Note Due on Visit 10    PT Start Time 4098    PT Stop Time 1559    PT Time Calculation (min) 44 min    Activity Tolerance Patient limited by pain    Behavior During Therapy Restless             History reviewed. No pertinent past medical history. 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/13/2016       PCP: Midge Minium   REFERRING PROVIDER: Gregor Hams   REFERRING DIAG: M54.2 (ICD-10-CM) - Neck pain M50.10 (ICD-10-CM) - Cervical disc disorder with radiculopathy of cervical region   THERAPY DIAG:  Cervicalgia   Muscle weakness (generalized)   Pain in right arm   ONSET DATE: 4 weeks ago   SUBJECTIVE:                                                                                                                                                                                                          SUBJECTIVE STATEMENT: 01/27/2022 States that she feels her pain is random and always at night around 5 pm. Overall she feels about 65% better  since the start of PT. Continues to report occasional but frequent along the C5/6 distribution  Eval: States about 4 weeks ago she woke up with neck and arm pain. States that she did a yogi stick up and had a little bit of pain the next day. States she has had 6 chiropractor adjustments and 3 massages and nothing has  helped. States she has been rolling around on a ennis ball. Always worse at night. 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 States that she now has flexoril which seems ot be helping. States that she got a new mattress in decemeber which seemed to helped it for a little bit but now she  is having pain. States she sleeps on her left side and supine with pillows propped up neck. She has tried heat/ice and nothing seems to be helping.   PERTINENT HISTORY:  hx of neck pain   PAIN:  Are you having pain? no: NPRS scale: 0/10 Pain location:  neck Pain description: aching, lava poring down arm Aggravating factors: worse at night 8/10, weightbearing and laying flat on her back.  Relieving factors: nerve flossing   PRECAUTIONS: None   WEIGHT BEARING RESTRICTIONS No   FALLS:  Has patient fallen in last 6 months? No     OCCUPATION: yoga instructor   PLOF: Independent   PATIENT GOALS to be able to have less pain and function   OBJECTIVE:    POSTURE:  Forward head, rounded shoulders   PALPATION: Tenderness to palpation along anterior and lateral neck into right shoulder - increased resting tone noted throughout.       CERVICAL ROM:    Active ROM A/PROM (deg) 01/27/2022  Flexion WFL*  Extension WFL  Right lateral flexion WFL  Left lateral flexion WFL  Right rotation    Left rotation     (Blank rows = not tested) *pain                UE Measurements       Upper Extremity Right 01/27/2022 Left 01/27/2022    A/PROM MMT A/PROM MMT  Shoulder Flexion WFL  4+   4+  Shoulder Extension          Shoulder Abduction  WFL 4+  WFL 4+  Shoulder Adduction          Shoulder Internal Rotation Reaches to L2 SP 4+ Reaches to T12 SP 4+  Shoulder External Rotation Reaches to T4 SP 4+ Reaches to T4 SP 4+  Elbow Flexion          Elbow Extension          Wrist Flexion          Wrist Extension          Wrist Supination          Wrist Pronation          Wrist Ulnar Deviation          Wrist Radial  Deviation          Grip Strength NA   NA                          (Blank rows = not tested)                       * pain     CERVICAL SPECIAL TESTS:  Distraction - painful, Spurling - painful      Interventions 01/27/2022 Neuro re-ed: TRA activation, dissociation of muscles - isolated activation, hamstring, biceps and push pull mechanism and body tension - tactile and verbal cues     PATIENT EDUCATION:  Education details: on push pull mechanism of muscles Person educated: Patient Education method: Consulting civil engineer, Demonstration, and Handouts Education comprehension: verbalized understanding       HOME EXERCISE PROGRAM: Indirection motion, gentle massage, standing exercises at wall    ASSESSMENT:   CLINICAL IMPRESSION: 01/27/2022 Session focused on education of body tension and push pull mechanism to protect joints. Very difficult for patient even with prior demonstration and tactile/verbal cues. Will continue with current POC as tolerated  Eval: Patient is a 36 y.o. female  who was seen today for physical therapy evaluation and treatment for right sided neck pain radiating into arm. Patient presenting with muscle spasms resulting in radicular symptoms and poor tolerance to today's interventions. Educated patient on gentle motions, avoiding painful motions and breath work. Answered all questions and reassured patient during session. Patient would greatly benefit from skilled PT to improve overall function and QOL     OBJECTIVE IMPAIRMENTS decreased activity tolerance, decreased ROM, decreased strength, increased muscle spasms, postural dysfunction, and pain.    ACTIVITY LIMITATIONS cleaning, community activity, occupation, and shopping.    PERSONAL FACTORS Time since onset of injury/illness/exacerbation and 1 comorbidity: history of neck pain  are also affecting patient's functional outcome.      REHAB POTENTIAL: Good   CLINICAL DECISION MAKING: Stable/uncomplicated   EVALUATION  COMPLEXITY: Low   GOALS: Goals reviewed with patient?  yes   SHORT TERM GOALS:   Patient will be independent in self management strategies to improve quality of life and functional outcomes. Baseline: new program Target date: 01/20/2022 Goal status: MET   2.  Patient will report at least 50% improvement in overall symptoms and/or function to demonstrate improved functional mobility Baseline: 0% Target date: 01/20/2022 Goal status: MET   3.  Patient will be able to lay supine without pain to improve ability to participate in supine yoga poses Baseline: unable Target date: 01/20/2022 Goal status: PARTIALLY MET - needs pillow           LONG TERM GOALS:   Patient will report at least 75% improvement in overall symptoms and/or function to demonstrate improved functional mobility Baseline: 0% Target date: 02/10/2022 Goal status: PROGRESSING    2.  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  Patient will be demonstrate painfree MMT Baseline: painful Target date: 02/10/2022 Goal status: MET         PLAN: PT FREQUENCY: 2x/week   PT DURATION: 6 weeks   PLANNED INTERVENTIONS: Therapeutic exercises, Therapeutic activity, Neuromuscular re-education, Balance training, Gait training, Patient/Family education, Joint manipulation, Joint mobilization, Canalith repositioning, Dry Needling, Electrical stimulation, Spinal manipulation, Spinal mobilization, Cryotherapy, Moist heat, Ionotophoresis 42m/ml Dexamethasone, and Manual therapy   PLAN FOR NEXT SESSION: down down video    4:06 PM, 01/27/22 MJerene Pitch DPT Physical Therapy with [facility]

## 2022-02-03 ENCOUNTER — Encounter: Payer: Self-pay | Admitting: Physical Therapy

## 2022-02-03 ENCOUNTER — Ambulatory Visit: Payer: BC Managed Care – PPO | Admitting: Physical Therapy

## 2022-02-03 DIAGNOSIS — M542 Cervicalgia: Secondary | ICD-10-CM

## 2022-02-03 DIAGNOSIS — M6281 Muscle weakness (generalized): Secondary | ICD-10-CM

## 2022-02-03 NOTE — Therapy (Addendum)
OUTPATIENT PHYSICAL THERAPY TREATMENT NOTE AND DISCHARGE NOTE PHYSICAL THERAPY DISCHARGE SUMMARY  Visits from Start of Care: 9  Current functional level related to goals / functional outcomes: Unable to assess due to unplanned discharge    Remaining deficits: Unable to assess due to unplanned discharge    Education / Equipment: Unable to assess due to unplanned discharge    Patient agrees to discharge. Patient goals were partially met. Patient is being discharged due to not returning since the last visit.  10:51 AM, 02/24/22 Jerene Pitch, DPT Physical Therapy with [facility]    Patient Name: Sherry Rodriguez MRN: [id number] DOB:1985-09-01, 36 y.o., female Today's Date: 02/03/2022  END OF SESSION:   PT End of Session - 02/03/22 0930     Visit Number 9    Number of Visits 12    Date for PT Re-Evaluation 02/10/22    Authorization - Visit Number 9    Authorization - Number of Visits 30    Progress Note Due on Visit 18    PT Start Time 0931    PT Stop Time 1012    PT Time Calculation (min) 41 min    Activity Tolerance Patient limited by pain    Behavior During Therapy Restless             History reviewed. No pertinent past medical history. 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 No pain in table top or in down dog but some discomfort in sphinx pose.   Eval: States about 4 weeks ago she woke up with neck and arm pain. States that she did a yogi stick up and had  a little bit of pain the next day. States she has had 6 chiropractor adjustments and 3 massages and nothing has helped. States she has been rolling around on a ennis ball. Always worse at night. 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 States that she now has flexoril which seems ot be helping. States that she got a new mattress in decemeber which seemed to helped it for a little bit but now she is having pain. States she sleeps on her left side and supine with pillows propped up neck. She has tried heat/ice and nothing seems to be helping.   PERTINENT HISTORY:  hx of neck pain   PAIN:  Are you having pain? no: NPRS scale: 0/10 Pain location:  neck Pain description: aching, lava poring down arm Aggravating factors: worse at night 8/10, weightbearing and laying flat on her back.  Relieving factors: nerve flossing   PRECAUTIONS: None   WEIGHT BEARING RESTRICTIONS No   FALLS:  Has patient fallen in last 6 months? No     OCCUPATION: yoga instructor   PLOF: Independent   PATIENT GOALS to be able to have less pain and function   OBJECTIVE:    POSTURE:  Forward head, rounded shoulders   PALPATION: Tenderness to palpation along anterior and lateral neck into right shoulder - increased resting tone noted throughout.       CERVICAL ROM:    Active ROM A/PROM (deg) 01/27/2022  Flexion WFL*  Extension WFL  Right lateral flexion WFL  Left lateral flexion WFL  Right rotation    Left rotation     (Blank rows = not tested) *pain                UE Measurements       Upper Extremity Right 01/27/2022 Left 01/27/2022    A/PROM MMT A/PROM MMT   Shoulder Flexion WFL  4+   4+  Shoulder Extension          Shoulder Abduction  WFL 4+  WFL 4+  Shoulder Adduction          Shoulder Internal Rotation Reaches to L2 SP 4+ Reaches to T12 SP 4+  Shoulder External Rotation Reaches to T4 SP 4+ Reaches to T4 SP 4+  Elbow Flexion          Elbow Extension          Wrist Flexion          Wrist Extension          Wrist Supination          Wrist Pronation          Wrist Ulnar Deviation          Wrist Radial Deviation          Grip Strength NA   NA                          (Blank rows = not tested)                       * pain     CERVICAL SPECIAL TESTS:  Distraction - painful, Spurling - painful      Interventions 02/03/2022 TE: Toe taps with core activation   Neuro re-ed: TRA activation with breathing supine - 5 minutes, hamstring stretch push/pull 15 minutes total B, bridge with pressing out out and down while keeping core/neutral spine 15 minutes, table top holds 5  minutes   PATIENT EDUCATION:  Education details: on push pull mechanism of muscles  Person educated: Patient Education method: Explanation, Demonstration, and Handouts Education comprehension: verbalized understanding       HOME EXERCISE PROGRAM: Indirection motion, gentle massage, standing exercises at wall    ASSESSMENT:   CLINICAL IMPRESSION: 02/03/2022 Session focused on muscle activation and push pull mechanism of 2 joint muscles. Improved activation on this date though frequent verbal and tactile cues required. Fatigue in muscles noted end of session but no pain. Will continue with current POC as tolerated.   Eval: Patient is a 36 y.o. female who was seen today for physical therapy evaluation and treatment for right sided neck pain radiating into arm. Patient presenting with muscle spasms resulting in radicular symptoms and poor tolerance to today's interventions. Educated patient on gentle motions, avoiding painful motions and breath work. Answered all  questions and reassured patient during session. Patient would greatly benefit from skilled PT to improve overall function and QOL     OBJECTIVE IMPAIRMENTS decreased activity tolerance, decreased ROM, decreased strength, increased muscle spasms, postural dysfunction, and pain.    ACTIVITY LIMITATIONS cleaning, community activity, occupation, and shopping.    PERSONAL FACTORS Time since onset of injury/illness/exacerbation and 1 comorbidity: history of neck pain  are also affecting patient's functional outcome.      REHAB POTENTIAL: Good   CLINICAL DECISION MAKING: Stable/uncomplicated   EVALUATION COMPLEXITY: Low   GOALS: Goals reviewed with patient?  yes   SHORT TERM GOALS:   Patient will be independent in self management strategies to improve quality of life and functional outcomes. Baseline: new program Target date: 01/20/2022 Goal status: MET   2.  Patient will report at least 50% improvement in overall symptoms and/or function to demonstrate improved functional mobility Baseline: 0% Target date: 01/20/2022 Goal status: MET   3.  Patient will be able to lay supine without pain to improve ability to participate in supine yoga poses Baseline: unable Target date: 01/20/2022 Goal status: PARTIALLY MET - needs pillow           LONG TERM GOALS:   Patient will report at least 75% improvement in overall symptoms and/or function to demonstrate improved functional mobility Baseline: 0% Target date: 02/10/2022 Goal status: PROGRESSING    2.  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  Patient will be demonstrate painfree MMT Baseline: painful Target date: 02/10/2022 Goal status: MET         PLAN: PT FREQUENCY: 2x/week   PT DURATION: 6 weeks   PLANNED INTERVENTIONS: Therapeutic exercises, Therapeutic activity, Neuromuscular re-education, Balance training,  Gait training, Patient/Family education, Joint manipulation, Joint mobilization, Canalith repositioning, Dry Needling, Electrical stimulation, Spinal manipulation, Spinal mobilization, Cryotherapy, Moist heat, Ionotophoresis 26m/ml Dexamethasone, and Manual therapy   PLAN FOR NEXT SESSION: push/pull mechanism    10:14 AM, 02/03/22 MJerene Pitch DPT Physical Therapy with [facility]

## 2022-02-10 ENCOUNTER — Encounter: Payer: BC Managed Care – PPO | Admitting: Physical Therapy

## 2022-02-17 ENCOUNTER — Inpatient Hospital Stay: Admission: RE | Admit: 2022-02-17 | Payer: BC Managed Care – PPO | Source: Ambulatory Visit

## 2022-02-19 ENCOUNTER — Encounter: Payer: BC Managed Care – PPO | Admitting: Physical Therapy

## 2022-02-24 ENCOUNTER — Encounter: Payer: BC Managed Care – PPO | Admitting: Physical Therapy

## 2022-03-03 ENCOUNTER — Encounter: Payer: Self-pay | Admitting: Family Medicine

## 2022-03-04 ENCOUNTER — Other Ambulatory Visit: Payer: Self-pay | Admitting: Family

## 2022-03-04 MED ORDER — SCOPOLAMINE 1 MG/3DAYS TD PT72
1.0000 | MEDICATED_PATCH | TRANSDERMAL | 0 refills | Status: DC
Start: 2022-03-04 — End: 2022-06-02

## 2022-03-17 ENCOUNTER — Ambulatory Visit: Payer: BC Managed Care – PPO | Admitting: Family Medicine

## 2022-06-02 ENCOUNTER — Other Ambulatory Visit (HOSPITAL_COMMUNITY)
Admission: RE | Admit: 2022-06-02 | Discharge: 2022-06-02 | Disposition: A | Discharge status: Home / Self Care | Payer: BC Managed Care – PPO | Source: Ambulatory Visit | Attending: Family Medicine | Admitting: Family Medicine

## 2022-06-02 ENCOUNTER — Ambulatory Visit (INDEPENDENT_AMBULATORY_CARE_PROVIDER_SITE_OTHER): Payer: BC Managed Care – PPO | Admitting: Family Medicine

## 2022-06-02 ENCOUNTER — Encounter: Payer: Self-pay | Admitting: Family Medicine

## 2022-06-02 VITALS — BP 116/60 | HR 80 | Temp 99.5°F | Resp 16 | Ht 69.5 in | Wt 157.5 lb

## 2022-06-02 DIAGNOSIS — F419 Anxiety disorder, unspecified: Secondary | ICD-10-CM | POA: Diagnosis not present

## 2022-06-02 DIAGNOSIS — E559 Vitamin D deficiency, unspecified: Secondary | ICD-10-CM | POA: Diagnosis not present

## 2022-06-02 DIAGNOSIS — N898 Other specified noninflammatory disorders of vagina: Secondary | ICD-10-CM | POA: Insufficient documentation

## 2022-06-02 DIAGNOSIS — Z0000 Encounter for general adult medical examination without abnormal findings: Secondary | ICD-10-CM | POA: Diagnosis not present

## 2022-06-02 DIAGNOSIS — F411 Generalized anxiety disorder: Secondary | ICD-10-CM

## 2022-06-02 DIAGNOSIS — R221 Localized swelling, mass and lump, neck: Secondary | ICD-10-CM

## 2022-06-02 DIAGNOSIS — E7800 Pure hypercholesterolemia, unspecified: Secondary | ICD-10-CM

## 2022-06-02 LAB — HEPATIC FUNCTION PANEL
ALT: 11 U/L (ref 0–35)
AST: 15 U/L (ref 0–37)
Albumin: 4.4 g/dL (ref 3.5–5.2)
Alkaline Phosphatase: 51 U/L (ref 39–117)
Bilirubin, Direct: 0 mg/dL (ref 0.0–0.3)
Total Bilirubin: 0.3 mg/dL (ref 0.2–1.2)
Total Protein: 7.3 g/dL (ref 6.0–8.3)

## 2022-06-02 LAB — LIPID PANEL
Cholesterol: 171 mg/dL (ref 0–200)
HDL: 72.7 mg/dL (ref >39.00)
LDL Cholesterol: 83 mg/dL (ref 0–99)
NonHDL: 98.67
Total CHOL/HDL Ratio: 2
Triglycerides: 77 mg/dL (ref 0.0–149.0)
VLDL: 15.4 mg/dL (ref 0.0–40.0)

## 2022-06-02 LAB — CBC WITH DIFFERENTIAL/PLATELET
Basophils Absolute: 0 10*3/uL (ref 0.0–0.1)
Basophils Relative: 0.5 % (ref 0.0–3.0)
Eosinophils Absolute: 0 10*3/uL (ref 0.0–0.7)
Eosinophils Relative: 0.8 % (ref 0.0–5.0)
HCT: 34.2 % — ABNORMAL LOW (ref 36.0–46.0)
Hemoglobin: 11.5 g/dL — ABNORMAL LOW (ref 12.0–15.0)
Lymphocytes Relative: 20.9 % (ref 12.0–46.0)
Lymphs Abs: 1.3 10*3/uL (ref 0.7–4.0)
MCHC: 33.7 g/dL (ref 30.0–36.0)
MCV: 91.1 fl (ref 78.0–100.0)
Monocytes Absolute: 0.7 10*3/uL (ref 0.1–1.0)
Monocytes Relative: 11.2 % (ref 3.0–12.0)
Neutro Abs: 4.2 10*3/uL (ref 1.4–7.7)
Neutrophils Relative %: 66.6 % (ref 43.0–77.0)
Platelets: 224 10*3/uL (ref 150.0–400.0)
RBC: 3.75 Mil/uL — ABNORMAL LOW (ref 3.87–5.11)
RDW: 14.6 % (ref 11.5–15.5)
WBC: 6.3 10*3/uL (ref 4.0–10.5)

## 2022-06-02 LAB — BASIC METABOLIC PANEL
BUN: 11 mg/dL (ref 6–23)
CO2: 29 mEq/L (ref 19–32)
Calcium: 9.6 mg/dL (ref 8.4–10.5)
Chloride: 105 mEq/L (ref 96–112)
Creatinine, Ser: 0.79 mg/dL (ref 0.40–1.20)
GFR: 94.77 mL/min (ref >60.00)
Glucose, Bld: 74 mg/dL (ref 70–99)
Potassium: 3.6 mEq/L (ref 3.5–5.1)
Sodium: 142 mEq/L (ref 135–145)

## 2022-06-02 LAB — TSH: TSH: 0.54 u[IU]/mL (ref 0.35–5.50)

## 2022-06-02 LAB — VITAMIN D 25 HYDROXY (VIT D DEFICIENCY, FRACTURES): VITD: 43.83 ng/mL (ref 30.00–100.00)

## 2022-06-02 MED ORDER — HYDROXYZINE HCL 25 MG PO TABS: 25.0000 mg | ORAL_TABLET | Freq: Three times a day (TID) | ORAL | 0 refills | Status: AC | PRN

## 2022-06-02 MED ORDER — ALPRAZOLAM 0.25 MG PO TABS: 0.2500 mg | ORAL_TABLET | Freq: Two times a day (BID) | ORAL | 1 refills | Status: DC | PRN

## 2022-06-02 NOTE — Progress Notes (Signed)
   Subjective:    Patient ID: Sherry Rodriguez, female    DOB: 09/06/1985, 36 y.o.   MRN: [id number]  HPI CPE- due for pap (pt declines).  UTD on Tdap, declines flu.  Health Maintenance  Topic Date Due  . PAP SMEAR-Modifier  10/28/2019  . INFLUENZA VACCINE  11/23/2022 (Originally 03/25/2022)  . Hepatitis C Screening  01/01/2023 (Originally 10/26/2001)  . HIV Screening  01/01/2023 (Originally 10/27/1998)  . TETANUS/TDAP  10/13/2026  . HPV VACCINES  Aged Out  . COVID-19 Vaccine  Discontinued      Review of Systems Patient reports no vision/ hearing changes, adenopathy,fever, weight change,  persistant/recurrent hoarseness , swallowing issues, chest pain, palpitations, edema, persistant/recurrent cough, hemoptysis, dyspnea (rest/exertional/paroxysmal nocturnal), gastrointestinal bleeding (melena, rectal bleeding), abdominal pain, significant heartburn, bowel changes, GU symptoms (dysuria, hematuria, incontinence), focal weakness, memory loss, numbness & tingling, skin/hair/nail changes, abnormal bruising or bleeding, anxiety, or depression.   'i think I have a yeast infection' + syncope- first night of heavy period    Objective:   Physical Exam General Appearance:    Alert, cooperative, no distress, appears stated age  Head:    Normocephalic, without obvious abnormality, atraumatic  Eyes:    PERRL, conjunctiva/corneas clear, EOM's intact both eyes  Ears:    Normal TM's and external ear canals, both ears  Nose:   Nares normal, septum midline, mucosa normal, no drainage    or sinus tenderness  Throat:   Lips, mucosa, and tongue normal; teeth and gums normal  Neck:   Supple, symmetrical, trachea midline, no adenopathy;    Thyroid: no enlargement/tenderness.  L sided neck mass  Back:     Symmetric, no curvature, ROM normal, no CVA tenderness  Lungs:     Clear to auscultation bilaterally, respirations unlabored  Chest Wall:    No tenderness or deformity   Heart:    Regular rate and rhythm, S1 and  S2 normal, no murmur, rub   or gallop  Breast Exam:    Deferred to GYN  Abdomen:     Soft, non-tender, bowel sounds active all four quadrants,    no masses, no organomegaly  Genitalia:    Deferred to GYN  Rectal:    Extremities:   Extremities normal, atraumatic, no cyanosis or edema  Pulses:   2+ and symmetric all extremities  Skin:   Skin color, texture, turgor normal, no rashes or lesions  Lymph nodes:   Cervical, supraclavicular, and axillary nodes normal  Neurologic:   CNII-XII intact, normal strength, sensation and reflexes    throughout          Assessment & Plan:

## 2022-06-02 NOTE — Assessment & Plan Note (Signed)
Pt's PE WNL w/ exception of known neck mass.  Due for pap but pt declines today.  UTD on Tdap.  Declines flu.  Check labs.  Anticipatory guidance provided.

## 2022-06-02 NOTE — Patient Instructions (Addendum)
Follow up in 1 year or as needed We'll notify you of your lab results and make any changes if needed Keep up the good work on healthy diet and regular exercise- you look great! We'll call you to schedule your ENT appt Take your iron supplement 2 days prior to your cycle and while on your cycle Call with any questions or concerns Stay Safe!  Stay Healthy!

## 2022-06-02 NOTE — Assessment & Plan Note (Signed)
Pt did not have CT done b/c out of pocket cost was ~$600.  Area needs to be evaluated.  Will refer to ENT.  Pt expressed understanding and is in agreement w/ plan.

## 2022-06-02 NOTE — Assessment & Plan Note (Signed)
Well controlled on current meds.  Refills sent.

## 2022-06-03 NOTE — Progress Notes (Signed)
Informed pt of Dr Tabori message  

## 2022-06-03 NOTE — Progress Notes (Signed)
Informed pt of lab results  

## 2022-06-04 ENCOUNTER — Encounter: Payer: Self-pay | Admitting: Family Medicine

## 2022-06-04 LAB — URINE CYTOLOGY ANCILLARY ONLY
Bacterial Vaginitis-Urine: NEGATIVE
Candida Urine: NEGATIVE

## 2022-06-05 NOTE — Progress Notes (Signed)
Informed pf of urine results

## 2022-06-17 ENCOUNTER — Encounter: Payer: Self-pay | Admitting: Family Medicine

## 2022-08-04 DIAGNOSIS — R221 Localized swelling, mass and lump, neck: Secondary | ICD-10-CM | POA: Diagnosis not present

## 2022-08-05 ENCOUNTER — Encounter: Payer: Self-pay | Admitting: General Practice

## 2022-08-05 ENCOUNTER — Other Ambulatory Visit (HOSPITAL_COMMUNITY): Payer: Self-pay | Admitting: Otolaryngology

## 2022-08-05 DIAGNOSIS — R221 Localized swelling, mass and lump, neck: Secondary | ICD-10-CM

## 2022-08-05 NOTE — Progress Notes (Signed)
Suttle, Thressa Sheller, MD  Caroleen Hamman, NT Approved for US guided neck mass core biopsy.    Dylan       Previous Messages    ----- Message ----- From: Caroleen Hamman, NT Sent: 08/05/2022  10:13 AM EST To: [facility] Procedure Requests Subject: [place] FNA SOFT TISSUE                            Procedure: [place] FNA SOFT TISSUE  Reason: Neck Mass  History: [place] in chart  Provider: Laren Boom, DO  Contact: [phone]

## 2022-08-28 ENCOUNTER — Other Ambulatory Visit: Payer: Self-pay | Admitting: Radiology

## 2022-08-28 DIAGNOSIS — R221 Localized swelling, mass and lump, neck: Secondary | ICD-10-CM

## 2022-08-29 ENCOUNTER — Other Ambulatory Visit: Payer: Self-pay | Admitting: Student

## 2022-08-29 ENCOUNTER — Other Ambulatory Visit: Payer: Self-pay | Admitting: Internal Medicine

## 2022-09-01 ENCOUNTER — Other Ambulatory Visit (HOSPITAL_COMMUNITY): Payer: Self-pay | Admitting: Otolaryngology

## 2022-09-01 ENCOUNTER — Ambulatory Visit (HOSPITAL_COMMUNITY)
Admission: RE | Admit: 2022-09-01 | Discharge: 2022-09-01 | Disposition: A | Discharge status: Home / Self Care | Payer: BC Managed Care – PPO | Source: Ambulatory Visit | Attending: Otolaryngology | Admitting: Otolaryngology

## 2022-09-01 ENCOUNTER — Other Ambulatory Visit: Payer: Self-pay

## 2022-09-01 DIAGNOSIS — R221 Localized swelling, mass and lump, neck: Secondary | ICD-10-CM

## 2022-09-01 LAB — PREGNANCY, URINE: Preg Test, Ur: NEGATIVE

## 2022-09-01 LAB — PROTIME-INR
INR: 1 (ref 0.8–1.2)
Prothrombin Time: 13 s (ref 11.4–15.2)

## 2022-09-01 LAB — CBC
HCT: 38.3 % (ref 36.0–46.0)
Hemoglobin: 13.2 g/dL (ref 12.0–15.0)
MCH: 31.9 pg (ref 26.0–34.0)
MCHC: 34.5 g/dL (ref 30.0–36.0)
MCV: 92.5 fL (ref 80.0–100.0)
Platelets: 247 K/uL (ref 150–400)
RBC: 4.14 MIL/uL (ref 3.87–5.11)
RDW: 13.3 % (ref 11.5–15.5)
WBC: 4.4 K/uL (ref 4.0–10.5)
nRBC: 0 % (ref 0.0–0.2)

## 2022-09-01 MED ORDER — FENTANYL CITRATE (PF) 100 MCG/2ML IJ SOLN
INTRAMUSCULAR | Status: AC | PRN
  Administered 2022-09-01 (×2): 25 ug via INTRAVENOUS

## 2022-09-01 MED ORDER — LIDOCAINE HCL (PF) 1 % IJ SOLN
INTRAMUSCULAR | Status: AC
  Filled 2022-09-01: qty 30

## 2022-09-01 MED ORDER — MIDAZOLAM HCL 2 MG/2ML IJ SOLN
INTRAMUSCULAR | Status: AC | PRN
  Administered 2022-09-01: 1 mg via INTRAVENOUS

## 2022-09-01 MED ORDER — MIDAZOLAM HCL 2 MG/2ML IJ SOLN
INTRAMUSCULAR | Status: AC
  Filled 2022-09-01: qty 2

## 2022-09-01 MED ORDER — FENTANYL CITRATE (PF) 100 MCG/2ML IJ SOLN
INTRAMUSCULAR | Status: AC
  Filled 2022-09-01: qty 2

## 2022-09-01 MED ORDER — SODIUM CHLORIDE 0.9 % IV SOLN: INTRAVENOUS | Status: DC

## 2022-09-01 MED ORDER — LIDOCAINE HCL (PF) 1 % IJ SOLN
7.0000 mL | Freq: Once | INTRAMUSCULAR | Status: AC
  Administered 2022-09-01: 7 mL via INTRADERMAL

## 2022-09-01 NOTE — Procedures (Signed)
Interventional Radiology Progress Note  Irregular complex cystic/solid abnormality of upper neck in midline appears to be associated with anterior larynx/cartilage. Needle passage was impeded by cartilage and not felt safe to perform biopsy. No biopsy performed.  Venetia Night. Kathlene Cote, M.D Pager:  [phone]

## 2022-09-01 NOTE — H&P (Signed)
Chief Complaint: Patient was seen in consultation today for neck mass  Referring Physician(s): Skotnicki,Meghan A  Supervising Physician: Gilmer Mor  Patient Status: [facility] - Out-pt  History of Present Illness: Sherry Rodriguez is a 37 y.o. female with no significant past medical history who presents to [facility] Radiology for left neck mass.  She was evaluated for left neck mass by Dr. Marene Lenz and recommended US-guided biopsy.  Case reviewed and approved by Dr. Elby Showers.   Patient presents to [facility] Radiology today in her usual state of health. She has no new complaints or concerns.  She has been NPO.  She does not take blood thinners.   No past medical history on file.  Past Surgical History:  Procedure Laterality Date   CESAREAN SECTION      Allergies: Tamiflu [oseltamivir phosphate]  Medications: Prior to Admission medications   Medication Sig Start Date End Date Taking? Authorizing Provider  Acetaminophen (MIDOL PO) Take 2 tablets by mouth daily as needed (cramping/pain).   Yes [provider]  ALPRAZolam (XANAX) 0.25 MG tablet Take 1 tablet (0.25 mg total) by mouth 2 (two) times daily as needed for anxiety. 06/02/22  Yes Sheliah Hatch, MD  Calcium Carb-Cholecalciferol (CALCIUM 600 + D PO) Take 1 tablet by mouth daily.   Yes [provider]  ferrous sulfate 325 (65 FE) MG tablet Take 325 mg by mouth See admin instructions. Take 325 mg daily for 3 days around cycle per month   Yes [provider]  hydrOXYzine (ATARAX) 25 MG tablet Take 1 tablet (25 mg total) by mouth 3 (three) times daily as needed. Patient taking differently: Take 25 mg by mouth at bedtime as needed (sleep). 06/02/22  Yes Sheliah Hatch, MD  loratadine (CLARITIN) 10 MG tablet Take 10 mg by mouth daily.   Yes [provider]  MAGNESIUM PO Take 1 tablet by mouth daily.   Yes [provider]  Multiple Vitamin (MULTIVITAMIN WITH MINERALS) TABS tablet Take 1 tablet by  mouth daily.   Yes [provider]  OVER THE COUNTER MEDICATION Take 1 capsule by mouth daily. doterra on guard   Yes [provider]  TURMERIC PO Take 1 tablet by mouth daily.   Yes [provider]  Vitamin D-Vitamin K (K2-D3 5000) 5000-90 UNIT-MCG CAPS Take 1 capsule by mouth daily.   Yes [provider]     Family History  Problem Relation Age of Onset   Healthy Mother    Healthy Father    Heart failure Paternal Grandmother    Diabetes Paternal Grandmother     Social History   Socioeconomic History   Marital status: Married    Spouse name: Not on file   Number of children: Not on file   Years of education: Not on file   Highest education level: Not on file  Occupational History   Not on file  Tobacco Use   Smoking status: Never   Smokeless tobacco: Never  Substance and Sexual Activity   Alcohol use: Yes   Drug use: Yes    Types: Marijuana   Sexual activity: Not on file  Other Topics Concern   Not on file  Social History Narrative   Not on file   Social Determinants of Health   Financial Resource Strain: Not on file  Food Insecurity: Not on file  Transportation Needs: Not on file  Physical Activity: Not on file  Stress: Not on file  Social Connections: Not on file  Review of Systems: A 12 point ROS discussed and pertinent positives are indicated in the HPI above.  All other systems are negative.  Review of Systems  Constitutional:  Negative for fatigue and fever.  Respiratory:  Negative for cough and shortness of breath.   Cardiovascular:  Negative for chest pain.  Gastrointestinal:  Negative for abdominal pain.  Psychiatric/Behavioral:  Negative for behavioral problems and confusion.     Vital Signs: BP 113/86   Pulse 68   Temp (!) 97.4 F (36.3 C) (Temporal)   Resp 17   Ht 5\' 9"  (1.753 m)   Wt 155 lb (70.3 kg)   SpO2 98%   BMI 22.89 kg/m   Physical Exam Vitals and nursing note reviewed.  Constitutional:       General: She is not in acute distress.    Appearance: Normal appearance. She is not ill-appearing.  HENT:     Mouth/Throat:     Mouth: Mucous membranes are moist.     Pharynx: Oropharynx is clear.  Neck:     Comments: Neck mass Cardiovascular:     Rate and Rhythm: Normal rate and regular rhythm.  Pulmonary:     Effort: Pulmonary effort is normal.     Breath sounds: Normal breath sounds.  Skin:    General: Skin is warm and dry.  Neurological:     General: No focal deficit present.     Mental Status: She is alert and oriented to person, place, and time. Mental status is at baseline.  Psychiatric:        Mood and Affect: Mood normal.        Behavior: Behavior normal.        Thought Content: Thought content normal.        Judgment: Judgment normal.      MD Evaluation Airway: WNL Heart: WNL Abdomen: WNL Chest/ Lungs: WNL ASA  Classification: 3 Mallampati/Airway Score: Two   Imaging: No results found.  Labs:  CBC: Recent Labs    01/13/22 1422 06/02/22 1340 09/01/22 1206  WBC 7.1 6.3 4.4  HGB 12.6 11.5* 13.2  HCT 37.5 34.2* 38.3  PLT 276.0 224.0 247    COAGS: Recent Labs    09/01/22 1206  INR 1.0    BMP: Recent Labs    06/02/22 1340  NA 142  K 3.6  CL 105  CO2 29  GLUCOSE 74  BUN 11  CALCIUM 9.6  CREATININE 0.79    LIVER FUNCTION TESTS: Recent Labs    06/02/22 1340  BILITOT 0.3  AST 15  ALT 11  ALKPHOS 51  PROT 7.3  ALBUMIN 4.4    TUMOR MARKERS: No results for input(s): "AFPTM", "CEA", "CA199", "CHROMGRNA" in the last 8760 hours.  Assessment and Plan: Patient with no significant past medical history who presents with complaint of left neck mass.  IR consulted for biopsy at the request of Dr. Fredric Dine. Case reviewed by Dr. Serafina Royals who approves patient for procedure.  Patient presents today in their usual state of health.  She has been NPO and is not currently on blood thinners.   Risks and benefits was discussed with the  patient and/or patient's family including, but not limited to bleeding, infection, damage to adjacent structures or low yield requiring additional tests.  All of the questions were answered and there is agreement to proceed.  Consent signed and in chart.  Thank you for this interesting consult.  I greatly enjoyed meeting Sherry Rodriguez and look forward to participating in  their care.  A copy of this report was sent to the requesting provider on this date.  Electronically Signed: Hoyt Koch, PA 09/01/2022, 12:49 PM   I spent a total of  30 Minutes   in face to face in clinical consultation, greater than 50% of which was counseling/coordinating care for left neck mass.

## 2022-09-04 ENCOUNTER — Other Ambulatory Visit: Payer: Self-pay | Admitting: Otolaryngology

## 2022-09-04 DIAGNOSIS — R221 Localized swelling, mass and lump, neck: Secondary | ICD-10-CM

## 2022-10-06 ENCOUNTER — Ambulatory Visit
Admission: RE | Admit: 2022-10-06 | Discharge: 2022-10-06 | Disposition: A | Discharge status: Home / Self Care | Payer: BC Managed Care – PPO | Source: Ambulatory Visit | Attending: Otolaryngology | Admitting: Otolaryngology

## 2022-10-06 DIAGNOSIS — R221 Localized swelling, mass and lump, neck: Secondary | ICD-10-CM

## 2022-10-06 DIAGNOSIS — M47812 Spondylosis without myelopathy or radiculopathy, cervical region: Secondary | ICD-10-CM | POA: Diagnosis not present

## 2022-10-06 DIAGNOSIS — M50222 Other cervical disc displacement at C5-C6 level: Secondary | ICD-10-CM | POA: Diagnosis not present

## 2022-10-06 DIAGNOSIS — M4802 Spinal stenosis, cervical region: Secondary | ICD-10-CM | POA: Diagnosis not present

## 2022-10-06 MED ORDER — IOPAMIDOL (ISOVUE-300) INJECTION 61%
75.0000 mL | Freq: Once | INTRAVENOUS | Status: AC | PRN
  Administered 2022-10-06: 75 mL via INTRAVENOUS

## 2022-11-13 DIAGNOSIS — R221 Localized swelling, mass and lump, neck: Secondary | ICD-10-CM | POA: Diagnosis not present

## 2022-11-26 ENCOUNTER — Other Ambulatory Visit: Payer: Self-pay | Admitting: Otolaryngology

## 2023-01-01 NOTE — Pre-Procedure Instructions (Signed)
Surgical Instructions    Your procedure is scheduled on Jan 09, 2023.  Report to [facility] Main Entrance "A" at 8:30 A.M., then check in with the Admitting office.  Call this number if you have problems the morning of surgery:  [phone]  If you have any questions prior to your surgery date call [phone]: Open Monday-Friday 8am-4pm If you experience any cold or flu symptoms such as cough, fever, chills, shortness of breath, etc. between now and your scheduled surgery, please notify us at the above number.     Remember:  Do not eat after midnight the night before your surgery  You may drink clear liquids until 7:30 AM the morning of your surgery.   Clear liquids allowed are: Water, Non-Citrus Juices (without pulp), Carbonated Beverages, Clear Tea, Black Coffee Only (NO MILK, CREAM OR POWDERED CREAMER of any kind), and Gatorade.     Take these medicines the morning of surgery with A SIP OF WATER:  loratadine (CLARITIN)   ALPRAZolam Prudy Feeler) - may take if needed   As of today, STOP taking any Aspirin (unless otherwise instructed by your surgeon) Aleve, Naproxen, Ibuprofen, Motrin, Advil, Goody's, BC's, all herbal medications, fish oil, and all vitamins.                     Do NOT Smoke (Tobacco/Vaping) for 24 hours prior to your procedure.  If you use a CPAP at night, you may bring your mask/headgear for your overnight stay.   Contacts, glasses, piercing's, hearing aid's, dentures or partials may not be worn into surgery, please bring cases for these belongings.    For patients admitted to the hospital, discharge time will be determined by your treatment team.   Patients discharged the day of surgery will not be allowed to drive home, and someone needs to stay with them for 24 hours.  SURGICAL WAITING ROOM VISITATION Patients having surgery or a procedure may have no more than 2 support people in the waiting area - these visitors may rotate.   Children under the age of 28  must have an adult with them who is not the patient. If the patient needs to stay at the hospital during part of their recovery, the visitor guidelines for inpatient rooms apply. Pre-op nurse will coordinate an appropriate time for 1 support person to accompany patient in pre-op.  This support person may not rotate.   Please refer to the [facility] website for the visitor guidelines for Inpatients (after your surgery is over and you are in a regular room).    Special instructions:   [place]- Preparing For Surgery  Before surgery, you can play an important role. Because skin is not sterile, your skin needs to be as free of germs as possible. You can reduce the number of germs on your skin by washing with CHG (chlorahexidine gluconate) Soap before surgery.  CHG is an antiseptic cleaner which kills germs and bonds with the skin to continue killing germs even after washing.    Oral Hygiene is also important to reduce your risk of infection.  Remember - BRUSH YOUR TEETH THE MORNING OF SURGERY WITH YOUR REGULAR TOOTHPASTE  Please do not use if you have an allergy to CHG or antibacterial soaps. If your skin becomes reddened/irritated stop using the CHG.  Do not shave (including legs and underarms) for at least 48 hours prior to first CHG shower. It is OK to shave your face.  Please follow these instructions carefully.  Shower the NIGHT BEFORE SURGERY and the MORNING OF SURGERY  If you chose to wash your hair, wash your hair first as usual with your normal shampoo.  After you shampoo, rinse your hair and body thoroughly to remove the shampoo.  Use CHG Soap as you would any other liquid soap. You can apply CHG directly to the skin and wash gently with a scrungie or a clean washcloth.   Apply the CHG Soap to your body ONLY FROM THE NECK DOWN.  Do not use on open wounds or open sores. Avoid contact with your eyes, ears, mouth and genitals (private parts). Wash Face and genitals (private parts)   with your normal soap.   Wash thoroughly, paying special attention to the area where your surgery will be performed.  Thoroughly rinse your body with warm water from the neck down.  DO NOT shower/wash with your normal soap after using and rinsing off the CHG Soap.  Pat yourself dry with a CLEAN TOWEL.  Wear CLEAN PAJAMAS to bed the night before surgery  Place CLEAN SHEETS on your bed the night before your surgery  DO NOT SLEEP WITH PETS.   Day of Surgery: Take a shower with CHG soap. Do not wear jewelry or makeup Do not wear lotions, powders, perfumes/colognes, or deodorant. Do not shave 48 hours prior to surgery.  Men may shave face and neck. Do not bring valuables to the hospital.  [facility] is not responsible for any belongings or valuables. Do not wear nail polish, gel polish, artificial nails, or any other type of covering on natural nails (fingers and toes) If you have artificial nails or gel coating that need to be removed by a nail salon, please have this removed prior to surgery. Artificial nails or gel coating may interfere with anesthesia's ability to adequately monitor your vital signs.  Wear Clean/Comfortable clothing the morning of surgery Remember to brush your teeth WITH YOUR REGULAR TOOTHPASTE.   Please read over the following fact sheets that you were given.    If you received a COVID test during your pre-op visit  it is requested that you wear a mask when out in public, stay away from anyone that may not be feeling well and notify your surgeon if you develop symptoms. If you have been in contact with anyone that has tested positive in the last 10 days please notify you surgeon.

## 2023-01-02 ENCOUNTER — Encounter (HOSPITAL_COMMUNITY)
Admission: RE | Admit: 2023-01-02 | Discharge: 2023-01-02 | Disposition: A | Discharge status: Home / Self Care | Payer: BC Managed Care – PPO | Source: Ambulatory Visit | Attending: Otolaryngology | Admitting: Otolaryngology

## 2023-01-02 ENCOUNTER — Other Ambulatory Visit: Payer: Self-pay

## 2023-01-02 ENCOUNTER — Encounter (HOSPITAL_COMMUNITY): Payer: Self-pay

## 2023-01-02 VITALS — BP 120/67 | HR 73 | Temp 98.6°F | Resp 18 | Ht 69.0 in | Wt 155.6 lb

## 2023-01-02 DIAGNOSIS — Z01818 Encounter for other preprocedural examination: Secondary | ICD-10-CM

## 2023-01-02 DIAGNOSIS — Z01812 Encounter for preprocedural laboratory examination: Secondary | ICD-10-CM | POA: Insufficient documentation

## 2023-01-02 HISTORY — DX: Unspecified asthma, uncomplicated: J45.909

## 2023-01-02 HISTORY — DX: Anxiety disorder, unspecified: F41.9

## 2023-01-02 HISTORY — DX: Anemia, unspecified: D64.9

## 2023-01-02 LAB — CBC
HCT: 40 % (ref 36.0–46.0)
Hemoglobin: 13.3 g/dL (ref 12.0–15.0)
MCH: 30.9 pg (ref 26.0–34.0)
MCHC: 33.3 g/dL (ref 30.0–36.0)
MCV: 93 fL (ref 80.0–100.0)
Platelets: 277 10*3/uL (ref 150–400)
RBC: 4.3 MIL/uL (ref 3.87–5.11)
RDW: 13.8 % (ref 11.5–15.5)
WBC: 7.5 10*3/uL (ref 4.0–10.5)
nRBC: 0 % (ref 0.0–0.2)

## 2023-01-02 NOTE — Progress Notes (Signed)
PCP - Dr. Neena Rhymes Cardiologist - denies  PPM/ICD - denies   Chest x-ray - denies EKG - denies Stress Test - denies ECHO - denies Cardiac Cath - denies  Sleep Study - denies   DM- denies  ASA/Blood Thinner Instructions: n/a   ERAS Protcol - yes, no drink   COVID TEST- n/a   Anesthesia review: no  Patient denies shortness of breath, fever, cough and chest pain at PAT appointment   All instructions explained to the patient, with a verbal understanding of the material. Patient agrees to go over the instructions while at home for a better understanding. The opportunity to ask questions was provided.

## 2023-01-04 DIAGNOSIS — N3001 Acute cystitis with hematuria: Secondary | ICD-10-CM | POA: Diagnosis not present

## 2023-01-05 ENCOUNTER — Other Ambulatory Visit (HOSPITAL_COMMUNITY): Payer: BC Managed Care – PPO

## 2023-01-09 ENCOUNTER — Ambulatory Visit (HOSPITAL_COMMUNITY): Payer: BC Managed Care – PPO | Admitting: Anesthesiology

## 2023-01-09 ENCOUNTER — Encounter (HOSPITAL_COMMUNITY): Payer: Self-pay | Admitting: Otolaryngology

## 2023-01-09 ENCOUNTER — Other Ambulatory Visit (HOSPITAL_COMMUNITY): Payer: Self-pay

## 2023-01-09 ENCOUNTER — Other Ambulatory Visit: Payer: Self-pay

## 2023-01-09 ENCOUNTER — Encounter (HOSPITAL_COMMUNITY)
Admission: RE | Disposition: A | Discharge status: Home / Self Care | Payer: Self-pay | Source: Home / Self Care | Attending: Otolaryngology

## 2023-01-09 ENCOUNTER — Observation Stay (HOSPITAL_COMMUNITY)
Admission: RE | Admit: 2023-01-09 | Discharge: 2023-01-10 | Disposition: A | Discharge status: Home / Self Care | Payer: BC Managed Care – PPO | Attending: Otolaryngology | Admitting: Otolaryngology

## 2023-01-09 DIAGNOSIS — R221 Localized swelling, mass and lump, neck: Secondary | ICD-10-CM | POA: Diagnosis not present

## 2023-01-09 DIAGNOSIS — J45909 Unspecified asthma, uncomplicated: Secondary | ICD-10-CM | POA: Diagnosis not present

## 2023-01-09 DIAGNOSIS — Z79899 Other long term (current) drug therapy: Secondary | ICD-10-CM | POA: Diagnosis not present

## 2023-01-09 DIAGNOSIS — Q892 Congenital malformations of other endocrine glands: Secondary | ICD-10-CM | POA: Diagnosis not present

## 2023-01-09 DIAGNOSIS — Z01818 Encounter for other preprocedural examination: Secondary | ICD-10-CM

## 2023-01-09 HISTORY — PX: THYROGLOSSAL DUCT CYST: SHX297

## 2023-01-09 LAB — POCT PREGNANCY, URINE: Preg Test, Ur: NEGATIVE

## 2023-01-09 SURGERY — EXCISION, THYROGLOSSAL DUCT CYST
Anesthesia: General | Laterality: Bilateral

## 2023-01-09 MED ORDER — ONDANSETRON HCL 4 MG/2ML IJ SOLN
INTRAMUSCULAR | Status: DC | PRN
  Administered 2023-01-09: 4 mg via INTRAVENOUS

## 2023-01-09 MED ORDER — LIDOCAINE 2% (20 MG/ML) 5 ML SYRINGE
INTRAMUSCULAR | Status: DC | PRN
  Administered 2023-01-09: 80 mg via INTRAVENOUS

## 2023-01-09 MED ORDER — DEXAMETHASONE SODIUM PHOSPHATE 10 MG/ML IJ SOLN
INTRAMUSCULAR | Status: DC | PRN
  Administered 2023-01-09: 10 mg via INTRAVENOUS

## 2023-01-09 MED ORDER — SUCCINYLCHOLINE CHLORIDE 200 MG/10ML IV SOSY
PREFILLED_SYRINGE | INTRAVENOUS | Status: DC | PRN
  Administered 2023-01-09: 100 mg via INTRAVENOUS

## 2023-01-09 MED ORDER — HYDROCODONE-ACETAMINOPHEN 5-325 MG PO TABS
1.0000 | ORAL_TABLET | Freq: Four times a day (QID) | ORAL | 0 refills | Status: AC | PRN
  Filled 2023-01-09: qty 10, 3d supply, fill #0

## 2023-01-09 MED ORDER — ALBUTEROL SULFATE HFA 108 (90 BASE) MCG/ACT IN AERS: 1.0000 | INHALATION_SPRAY | RESPIRATORY_TRACT | Status: DC | PRN

## 2023-01-09 MED ORDER — ONDANSETRON HCL 4 MG/2ML IJ SOLN: 4.0000 mg | INTRAMUSCULAR | Status: DC | PRN

## 2023-01-09 MED ORDER — SCOPOLAMINE 1 MG/3DAYS TD PT72
1.0000 | MEDICATED_PATCH | Freq: Once | TRANSDERMAL | Status: DC
  Administered 2023-01-09: 1.5 mg via TRANSDERMAL
  Filled 2023-01-09: qty 1

## 2023-01-09 MED ORDER — FENTANYL CITRATE (PF) 250 MCG/5ML IJ SOLN
INTRAMUSCULAR | Status: DC | PRN
  Administered 2023-01-09 (×2): 100 ug via INTRAVENOUS
  Administered 2023-01-09: 50 ug via INTRAVENOUS

## 2023-01-09 MED ORDER — FENTANYL CITRATE (PF) 250 MCG/5ML IJ SOLN
INTRAMUSCULAR | Status: AC
  Filled 2023-01-09: qty 5

## 2023-01-09 MED ORDER — ROCURONIUM BROMIDE 10 MG/ML (PF) SYRINGE
PREFILLED_SYRINGE | INTRAVENOUS | Status: AC
  Filled 2023-01-09: qty 10

## 2023-01-09 MED ORDER — FENTANYL CITRATE (PF) 100 MCG/2ML IJ SOLN: 25.0000 ug | INTRAMUSCULAR | Status: DC | PRN

## 2023-01-09 MED ORDER — LIDOCAINE-EPINEPHRINE 1 %-1:100000 IJ SOLN
INTRAMUSCULAR | Status: AC
  Filled 2023-01-09: qty 1

## 2023-01-09 MED ORDER — EPINEPHRINE HCL (NASAL) 0.1 % NA SOLN
NASAL | Status: AC
  Filled 2023-01-09: qty 30

## 2023-01-09 MED ORDER — HYDROXYZINE HCL 25 MG PO TABS: 25.0000 mg | ORAL_TABLET | Freq: Every evening | ORAL | Status: DC | PRN

## 2023-01-09 MED ORDER — EPINEPHRINE HCL (NASAL) 0.1 % NA SOLN
NASAL | Status: DC | PRN
  Administered 2023-01-09: 10 mL via NASAL

## 2023-01-09 MED ORDER — ACETAMINOPHEN 500 MG PO TABS
1000.0000 mg | ORAL_TABLET | Freq: Once | ORAL | Status: AC
  Administered 2023-01-09: 1000 mg via ORAL

## 2023-01-09 MED ORDER — DEXMEDETOMIDINE HCL IN NACL 80 MCG/20ML IV SOLN
INTRAVENOUS | Status: AC
  Filled 2023-01-09: qty 20

## 2023-01-09 MED ORDER — MIDAZOLAM HCL 2 MG/2ML IJ SOLN
INTRAMUSCULAR | Status: DC | PRN
  Administered 2023-01-09: 2 mg via INTRAVENOUS

## 2023-01-09 MED ORDER — ONDANSETRON HCL 4 MG/2ML IJ SOLN
INTRAMUSCULAR | Status: AC
  Filled 2023-01-09: qty 6

## 2023-01-09 MED ORDER — ALBUTEROL SULFATE (2.5 MG/3ML) 0.083% IN NEBU: 2.5000 mg | INHALATION_SOLUTION | RESPIRATORY_TRACT | Status: DC | PRN

## 2023-01-09 MED ORDER — ORAL CARE MOUTH RINSE: 15.0000 mL | Freq: Once | OROMUCOSAL | Status: AC

## 2023-01-09 MED ORDER — CEFAZOLIN SODIUM-DEXTROSE 2-3 GM-%(50ML) IV SOLR
INTRAVENOUS | Status: DC | PRN
  Administered 2023-01-09: 2 g via INTRAVENOUS

## 2023-01-09 MED ORDER — LACTATED RINGERS IV SOLN: INTRAVENOUS | Status: DC

## 2023-01-09 MED ORDER — WHITE PETROLATUM EX OINT
TOPICAL_OINTMENT | CUTANEOUS | Status: DC | PRN
  Filled 2023-01-09: qty 28.35

## 2023-01-09 MED ORDER — PROPOFOL 10 MG/ML IV BOLUS
INTRAVENOUS | Status: DC | PRN
  Administered 2023-01-09: 50 ug/kg/min via INTRAVENOUS
  Administered 2023-01-09: 150 mg via INTRAVENOUS

## 2023-01-09 MED ORDER — LIDOCAINE-EPINEPHRINE 1 %-1:100000 IJ SOLN
INTRAMUSCULAR | Status: DC | PRN
  Administered 2023-01-09: 10 mL

## 2023-01-09 MED ORDER — IBUPROFEN 100 MG/5ML PO SUSP
600.0000 mg | Freq: Four times a day (QID) | ORAL | Status: DC | PRN
  Administered 2023-01-09: 600 mg via ORAL
  Filled 2023-01-09 (×2): qty 30

## 2023-01-09 MED ORDER — GLYCOPYRROLATE PF 0.2 MG/ML IJ SOSY
PREFILLED_SYRINGE | INTRAMUSCULAR | Status: AC
  Filled 2023-01-09: qty 2

## 2023-01-09 MED ORDER — PHENYLEPHRINE 80 MCG/ML (10ML) SYRINGE FOR IV PUSH (FOR BLOOD PRESSURE SUPPORT)
PREFILLED_SYRINGE | INTRAVENOUS | Status: AC
  Filled 2023-01-09: qty 10

## 2023-01-09 MED ORDER — SUCCINYLCHOLINE CHLORIDE 200 MG/10ML IV SOSY
PREFILLED_SYRINGE | INTRAVENOUS | Status: AC
  Filled 2023-01-09: qty 10

## 2023-01-09 MED ORDER — CHLORHEXIDINE GLUCONATE 0.12 % MT SOLN
15.0000 mL | Freq: Once | OROMUCOSAL | Status: AC
  Administered 2023-01-09: 15 mL via OROMUCOSAL

## 2023-01-09 MED ORDER — ONDANSETRON HCL 4 MG PO TABS: 4.0000 mg | ORAL_TABLET | ORAL | Status: DC | PRN

## 2023-01-09 MED ORDER — LIDOCAINE 2% (20 MG/ML) 5 ML SYRINGE
INTRAMUSCULAR | Status: AC
  Filled 2023-01-09: qty 15

## 2023-01-09 MED ORDER — HYDROCODONE-ACETAMINOPHEN 5-325 MG PO TABS
1.0000 | ORAL_TABLET | Freq: Four times a day (QID) | ORAL | Status: DC | PRN
  Administered 2023-01-09 – 2023-01-10 (×4): 1 via ORAL
  Filled 2023-01-09 (×4): qty 1

## 2023-01-09 MED ORDER — DOCUSATE SODIUM 100 MG PO CAPS: 100.0000 mg | ORAL_CAPSULE | Freq: Two times a day (BID) | ORAL | Status: DC | PRN

## 2023-01-09 MED ORDER — GLYCOPYRROLATE PF 0.2 MG/ML IJ SOSY
PREFILLED_SYRINGE | INTRAMUSCULAR | Status: AC
  Filled 2023-01-09: qty 1

## 2023-01-09 MED ORDER — ALPRAZOLAM 0.25 MG PO TABS
0.2500 mg | ORAL_TABLET | Freq: Two times a day (BID) | ORAL | Status: DC | PRN
  Administered 2023-01-09 – 2023-01-10 (×2): 0.25 mg via ORAL
  Filled 2023-01-09 (×2): qty 1

## 2023-01-09 MED ORDER — LORATADINE 10 MG PO TABS
10.0000 mg | ORAL_TABLET | Freq: Every day | ORAL | Status: DC
  Administered 2023-01-09 – 2023-01-10 (×2): 10 mg via ORAL
  Filled 2023-01-09 (×2): qty 1

## 2023-01-09 MED ORDER — MIDAZOLAM HCL 2 MG/2ML IJ SOLN
INTRAMUSCULAR | Status: AC
  Filled 2023-01-09: qty 2

## 2023-01-09 MED ORDER — KETAMINE HCL 50 MG/5ML IJ SOSY
PREFILLED_SYRINGE | INTRAMUSCULAR | Status: AC
  Filled 2023-01-09: qty 5

## 2023-01-09 MED ORDER — KETAMINE HCL 10 MG/ML IJ SOLN
INTRAMUSCULAR | Status: DC | PRN
  Administered 2023-01-09: 10 mg via INTRAVENOUS
  Administered 2023-01-09: 20 mg via INTRAVENOUS

## 2023-01-09 MED ORDER — EPHEDRINE 5 MG/ML INJ
INTRAVENOUS | Status: AC
  Filled 2023-01-09: qty 5

## 2023-01-09 MED ORDER — PROMETHAZINE HCL 25 MG/ML IJ SOLN: 6.2500 mg | INTRAMUSCULAR | Status: DC | PRN

## 2023-01-09 MED ORDER — DEXAMETHASONE SODIUM PHOSPHATE 10 MG/ML IJ SOLN
INTRAMUSCULAR | Status: AC
  Filled 2023-01-09: qty 3

## 2023-01-09 SURGICAL SUPPLY — 51 items
ADH SKN CLS APL DERMABOND .7 (GAUZE/BANDAGES/DRESSINGS) ×1
ADH SKN CLS LQ APL DERMABOND (GAUZE/BANDAGES/DRESSINGS) ×1
BAG COUNTER SPONGE SURGICOUNT (BAG) ×1 IMPLANT
BAG SPNG CNTER NS LX DISP (BAG) ×1
BLADE SURG 15 STRL LF DISP TIS (BLADE) ×1 IMPLANT
BLADE SURG 15 STRL SS (BLADE) ×1
CANISTER SUCT 3000ML PPV (MISCELLANEOUS) ×1 IMPLANT
CNTNR URN SCR LID CUP LEK RST (MISCELLANEOUS) IMPLANT
CONT SPEC 4OZ STRL OR WHT (MISCELLANEOUS)
CORD BIPOLAR FORCEPS 12FT (ELECTRODE) ×1 IMPLANT
COVER SURGICAL LIGHT HANDLE (MISCELLANEOUS) ×1 IMPLANT
DERMABOND ADVANCED .7 DNX12 (GAUZE/BANDAGES/DRESSINGS) ×1 IMPLANT
DERMABOND ADVANCED .7 DNX6 (GAUZE/BANDAGES/DRESSINGS) IMPLANT
DRAIN JACKSON RD 7FR 3/32 (WOUND CARE) IMPLANT
DRAIN JP 10F RND RADIO (DRAIN) IMPLANT
ELECT COATED BLADE 2.86 ST (ELECTRODE) ×1 IMPLANT
ELECT REM PT RETURN 9FT ADLT (ELECTROSURGICAL) ×1
ELECTRODE REM PT RTRN 9FT ADLT (ELECTROSURGICAL) ×1 IMPLANT
EVACUATOR SILICONE 100CC (DRAIN) IMPLANT
FORCEPS BIPOLAR SPETZLER 8 1.0 (NEUROSURGERY SUPPLIES) ×1 IMPLANT
GAUZE 4X4 16PLY ~~LOC~~+RFID DBL (SPONGE) ×1 IMPLANT
GLOVE BIO SURGEON STRL SZ 6.5 (GLOVE) ×1 IMPLANT
GOWN STRL REUS W/ TWL LRG LVL3 (GOWN DISPOSABLE) ×2 IMPLANT
GOWN STRL REUS W/TWL LRG LVL3 (GOWN DISPOSABLE) ×2
HEMOSTAT SURGICEL 2X14 (HEMOSTASIS) ×1 IMPLANT
KIT BASIN OR (CUSTOM PROCEDURE TRAY) ×1 IMPLANT
KIT TURNOVER KIT B (KITS) ×1 IMPLANT
LOCATOR NERVE 3 VOLT (DISPOSABLE) IMPLANT
NDL HYPO 25GX1X1/2 BEV (NEEDLE) ×1 IMPLANT
NEEDLE HYPO 25GX1X1/2 BEV (NEEDLE) ×1 IMPLANT
NS IRRIG 1000ML POUR BTL (IV SOLUTION) ×1 IMPLANT
PAD ARMBOARD 7.5X6 YLW CONV (MISCELLANEOUS) ×2 IMPLANT
PATTIES SURGICAL .5 X.5 (GAUZE/BANDAGES/DRESSINGS) IMPLANT
PENCIL SMOKE EVACUATOR (MISCELLANEOUS) ×1 IMPLANT
POSITIONER HEAD DONUT 9IN (MISCELLANEOUS) IMPLANT
PROBE NERVBE PRASS .33 (MISCELLANEOUS) IMPLANT
SET WALTER ACTIVATION W/DRAPE (SET/KITS/TRAYS/PACK) IMPLANT
SHEARS HARMONIC 9CM CVD (BLADE) ×1 IMPLANT
SHEARS HARMONIC FOCUS 9 REPROC (ELECTROSURGICAL) IMPLANT
SPONGE INTESTINAL PEANUT (DISPOSABLE) ×1 IMPLANT
STAPLER VISISTAT 35W (STAPLE) IMPLANT
SUT CHROMIC 4 0 PS 2 18 (SUTURE) IMPLANT
SUT MNCRL AB 4-0 PS2 18 (SUTURE) ×1 IMPLANT
SUT SILK 3 0 PS 1 (SUTURE) IMPLANT
SUT SILK 3 0 REEL (SUTURE) ×1 IMPLANT
SUT SILK 3 0SH CR/8 30 (SUTURE) ×1 IMPLANT
SUT VIC AB 3-0 SH 27 (SUTURE) ×1
SUT VIC AB 3-0 SH 27X BRD (SUTURE) ×1 IMPLANT
SUT VICRYL 4-0 PS2 18IN ABS (SUTURE) ×1 IMPLANT
TOWEL GREEN STERILE FF (TOWEL DISPOSABLE) ×1 IMPLANT
TRAY ENT MC OR (CUSTOM PROCEDURE TRAY) ×1 IMPLANT

## 2023-01-09 NOTE — H&P (Signed)
Sherry Rodriguez is an 37 y.o. female.    Chief Complaint:  Midline neck mass  HPI: Patient presents today for planned elective procedure.  She denies any interval change in history since office visit on 11/13/2022:  Sherry Rodriguez is a 37 y.o. female who presents as a return patient, referred by Self, Referral, for evaluation and treatment of midline neck mass. Patient was initially seen in our office in December for evaluation, and at that time, an ultrasound-guided biopsy was ordered for further evaluation. Unfortunately, ultrasound guided biopsy was unable to be successfully performed by technician due to difficulty passing needle, as lesion was noted to be posterior to the hyoid. Subsequently, a CT Neck with contrast was performed for further evaluation and completed in February. This noted a 2.5 x 1.8 x 2 centimeter cystic lesion within the midline ventral neck, embedded within the ventral stat muscles and posterior and inferior to the hyoid bone, most consistent with a thyroglossal duct cyst. Imaging demonstrated a normal-appearing thyroid gland and no other focal abnormality. Patient states that since initial evaluation duration, she has experienced some increased distress regarding the appearance of her neck secondary to the cyst. She also notes occasional feelings of compression, as well as globus sensation. No recent history of infection, no recent treatment with oral antibiotics.   Past Medical History:  Diagnosis Date   Anemia    hx   Anxiety    Asthma    athletic asthma per pt, does not use inhaler often    Past Surgical History:  Procedure Laterality Date   CESAREAN SECTION      Family History  Problem Relation Age of Onset   Healthy Mother    Healthy Father    Heart failure Paternal Grandmother    Diabetes Paternal Grandmother     Social History:  reports that she has never smoked. She has never used smokeless tobacco. She reports current alcohol use. She reports current  drug use. Frequency: 7.00 times per week. Drug: Marijuana.  Allergies:  Allergies  Allergen Reactions   Tamiflu [Oseltamivir Phosphate] Other (See Comments)    Caused sleepwalking    Medications Prior to Admission  Medication Sig Dispense Refill   Acetaminophen (MIDOL PO) Take 2 tablets by mouth daily as needed (cramping/pain).     ALPRAZolam (XANAX) 0.25 MG tablet Take 1 tablet (0.25 mg total) by mouth 2 (two) times daily as needed for anxiety. 30 tablet 1   Calcium Carb-Cholecalciferol (CALCIUM 600 + D PO) Take 1 tablet by mouth daily.     ferrous sulfate 325 (65 FE) MG tablet Take 325 mg by mouth See admin instructions. Take 325 mg daily for 3 days around cycle per month     hydrOXYzine (ATARAX) 25 MG tablet Take 1 tablet (25 mg total) by mouth 3 (three) times daily as needed. (Patient taking differently: Take 25 mg by mouth at bedtime as needed (sleep).) 30 tablet 0   loratadine (CLARITIN) 10 MG tablet Take 10 mg by mouth daily.     MAGNESIUM PO Take 1 tablet by mouth daily.     Multiple Vitamin (MULTIVITAMIN WITH MINERALS) TABS tablet Take 1 tablet by mouth daily.     OVER THE COUNTER MEDICATION Take 1 capsule by mouth daily. doterra on guard     TURMERIC PO Take 1 tablet by mouth daily.     Vitamin D-Vitamin K (K2-D3 5000) 5000-90 UNIT-MCG CAPS Take 1 capsule by mouth daily.     albuterol (VENTOLIN HFA) 108 (90  Base) MCG/ACT inhaler Inhale 1 puff into the lungs as needed for wheezing or shortness of breath.      Results for orders placed or performed during the hospital encounter of 01/09/23 (from the past 48 hour(s))  Pregnancy, urine POC     Status: None   Collection Time: 01/09/23  9:24 AM  Result Value Ref Range   Preg Test, Ur NEGATIVE NEGATIVE    Comment:        THE SENSITIVITY OF THIS METHODOLOGY IS >24 mIU/mL    No results found.  ROS: ROS  Blood pressure 125/72, pulse 66, temperature 98 F (36.7 C), temperature source Oral, resp. rate 18, height 5\' 9"  (1.753 m),  weight 70.3 kg, last menstrual period 12/21/2022, SpO2 95 %.  PHYSICAL EXAM: Physical Exam Constitutional:      Appearance: Normal appearance.  HENT:     Mouth/Throat:     Mouth: Mucous membranes are moist.  Neck:     Comments: Midline neck mass Pulmonary:     Effort: Pulmonary effort is normal.  Neurological:     General: No focal deficit present.     Mental Status: She is alert. Mental status is at baseline.     Studies Reviewed: CT Neck with contrast reviewed   Assessment/Plan Sherry Rodriguez is a 37 y.o. female with cystic lesion of midline neck, most consistent with thyroglossal duct cyst. FNA was ordered at time of last visit, but unable to be successfully performed due to location of cyst. Patient endorses ongoing symptoms of compression, globus sensation and abnormal appearance of her anterior neck due to mass effect.   - To OR today for removal of thyroglossal duct cyst and tissues that lie along its tract including a portion of the hyoid bone. Risks of surgery, benefits, recovery as well as postoperative restrictions were comprehensively reviewed with patient, who expressed understanding and agreement.    Sherry Rodriguez A Klohe Lovering 01/09/2023, 10:27 AM

## 2023-01-09 NOTE — Transfer of Care (Signed)
Immediate Anesthesia Transfer of Care Note  Patient: Sherry Rodriguez  Procedure(s) Performed: REMOVAL OF THYROGLOSSAL DUCT CYST WITH PORTION OF HYOID BONE (Bilateral)  Patient Location: PACU  Anesthesia Type:General  Level of Consciousness: sedated  Airway & Oxygen Therapy: Patient Spontanous Breathing and Patient connected to face mask oxygen  Post-op Assessment: Report given to RN and Post -op Vital signs reviewed and stable  Post vital signs: Reviewed and stable  Last Vitals:  Vitals Value Taken Time  BP 120/71 01/09/23 1252  Temp    Pulse 76 01/09/23 1255  Resp 12 01/09/23 1255  SpO2 98 % 01/09/23 1255  Vitals shown include unvalidated device data.  Last Pain:  Vitals:   01/09/23 0908  TempSrc:   PainSc: 0-No pain         Complications: No notable events documented.

## 2023-01-09 NOTE — Anesthesia Procedure Notes (Signed)
Procedure Name: Intubation Date/Time: 01/09/2023 10:53 AM  Performed by: Loleta Ceirra Belli, CRNAPre-anesthesia Checklist: Patient identified, Patient being monitored, Timeout performed, Emergency Drugs available and Suction available Patient Re-evaluated:Patient Re-evaluated prior to induction Oxygen Delivery Method: Circle system utilized Preoxygenation: Pre-oxygenation with 100% oxygen Induction Type: IV induction Ventilation: Mask ventilation without difficulty Laryngoscope Size: Mac and 3 Grade View: Grade I Tube type: Oral Tube size: 7.0 mm Number of attempts: 1 Airway Equipment and Method: Stylet Placement Confirmation: ETT inserted through vocal cords under direct vision, positive ETCO2 and breath sounds checked- equal and bilateral Secured at: 22 cm Tube secured with: Tape Dental Injury: Teeth and Oropharynx as per pre-operative assessment

## 2023-01-09 NOTE — Anesthesia Preprocedure Evaluation (Signed)
Anesthesia Evaluation  Patient identified by MRN, date of birth, ID band Patient awake    Reviewed: Allergy & Precautions, NPO status , Patient's Chart, lab work & pertinent test results  Airway Mallampati: I  TM Distance: >3 FB Neck ROM: Full    Dental  (+) Teeth Intact, Dental Advisory Given Lower permanent retainer :   Pulmonary asthma    Pulmonary exam normal breath sounds clear to auscultation       Cardiovascular negative cardio ROS Normal cardiovascular exam Rhythm:Regular Rate:Normal     Neuro/Psych  PSYCHIATRIC DISORDERS Anxiety     negative neurological ROS     GI/Hepatic negative GI ROS, Neg liver ROS,,,  Endo/Other  Localized swelling, mass and lump, neck  Renal/GU negative Renal ROS     Musculoskeletal negative musculoskeletal ROS (+)    Abdominal   Peds  Hematology negative hematology ROS (+)   Anesthesia Other Findings Day of surgery medications reviewed with the patient.  Reproductive/Obstetrics negative OB ROS                             Anesthesia Physical Anesthesia Plan  ASA: 2  Anesthesia Plan: General   Post-op Pain Management: Tylenol PO (pre-op)*   Induction: Intravenous  PONV Risk Score and Plan: 4 or greater and Scopolamine patch - Pre-op, Midazolam, Dexamethasone and Ondansetron  Airway Management Planned: Oral ETT  Additional Equipment:   Intra-op Plan:   Post-operative Plan: Extubation in OR  Informed Consent: I have reviewed the patients History and Physical, chart, labs and discussed the procedure including the risks, benefits and alternatives for the proposed anesthesia with the patient or authorized representative who has indicated his/her understanding and acceptance.     Dental advisory given  Plan Discussed with: CRNA  Anesthesia Plan Comments:        Anesthesia Quick Evaluation

## 2023-01-09 NOTE — TOC Transition Note (Signed)
Discharge medications (1) are being stored in the Transitions of Care (TOC) Pharmacy on the second floor until patient is ready for discharge.    

## 2023-01-09 NOTE — H&P (Signed)
OPERATIVE NOTE  Sherry Rodriguez Date/Time of Admission: 01/09/2023  8:34 AM  CSN: [id number] Attending Provider: Cheron Schaumann A, DO Room/Bed: MCPO/NONE DOB: Sep 13, 1985 Age: 37 y.o.   Pre-Op Diagnosis: Localized swelling, mass and lump, neck  Post-Op Diagnosis: Localized swelling, mass and lump, neck  Procedure: Procedure(s): REMOVAL OF THYROGLOSSAL DUCT CYST WITH EXCISION OF ASSOCIATED TRACT AND PORTION OF HYOID BONE (SISTRUNK PROCEDURE)  Anesthesia: General  Surgeon(s): Aubryn Spinola A Melissaann Dizdarevic, DO  Staff: Circulator: Lennox Laity, RN Relief Circulator: Hardin Negus, RN Scrub Person: Carmela Rima RN First Assistant: Josefa Half, RN  Implants: * No implants in log *  Specimens: ID Type Source Tests Collected by Time Destination  1 : MIDLINE NECK CYST WITH PORTION OF HYOID BONE Tissue PATH Soft tissue resection SURGICAL PATHOLOGY Sharyn Brilliant A, DO 01/09/2023 1226     Complications: None  EBL: <10 ML  Condition: stable  Operative Findings:  Large, midline cystic neck mass with dense adherence to hyoid, and no evidence of intraoral extension.   Description of Operation: The patient was identified in the holding room, and informed consent was obtained.  The patient was then brought to the operating room and placed supine on the table. General anesthesia was induced and the patient was orotracheally intubated. A prep verification and time out were performed. Preoperative antibiotics were administered.  A shoulder roll was placed. The planned neck incision was marked and  infiltrated with 1% lidocaine with 1:100,000 epinephrine.  The patient was then prepped and draped in the standard sterile fashion.   The incision was made through the skin and subcutaneous tissues with a 15 blade.  The platysma was divided and flaps elevated superiorly and inferiorly.  The neck mass was visualized and palpapated.  It was dissected with a  combination of blunt and sharp dissection.  The cyst was released inferiorly leaving a cuff a strap muscle attached to the cyst where adherent.  The thyroid notch of the thyroid cartilage was identified and freed from the cyst. The dissection was carried superiorly to the hyoid bone.  Bovie cautery was used to detach the musculature from the insertion on the superior middle third of the hyoid bone.   The central third of the hyoid bone was removed and dissection was carried superiorly, to remove a cuff of central tongue base musculature.  The cyst, strap muscles, hyoid and a track of tongue musculature were removed en bloc and passed off the field as specimen. The wound bed was copiously irrigated and meticulous hemostasis was achieved.    A 1/4 inch penrose drain was placed at the tongue base defect and sutured in place with a 3-0 silk.   The wound was then closed in layers.  The strap muscles were reapproximated in the midline with interrupted 4-0 vicryl.  The platysma/deep dermal layer was closed with interrupted 4-0 Vicryl.  The skin was closed with 5-0 subcuticular Monocryl.  Dermabond was placed over the incision.   The patient was returned to the care of the anesthesia staff, awakened, and transferred to recovery room in good condition.   Laren Boom, DO [facility]  01/09/2023

## 2023-01-09 NOTE — Progress Notes (Signed)
Pt arrived from PACU alert and orient. Pt penrose is intact, able to drink water, and speaking clear.   Pt is requesting for back pain relief, gave her pain medication, see MAR. Orient pt to the room and how to use call button, husband enter the room while explaining to the patient.   Pt disappoint about he waist beads been cut off her and did not understand why and this try to explain the protocol for surgery.

## 2023-01-09 NOTE — Discharge Instructions (Signed)
Neck Mass Excision Post Operative Instructions Office number [phone]  The Surgery Itself Neck mass removal involves general anesthesia, typically for 1-2 hours. Patients may be quite sedated for several hours after surgery and may remain sleepy for much of the day. Nausea and vomiting are occasionally seen, and usually resolve by the evening of surgery - even without additional medications. Most patients go home the same day as surgery.  Your Incision Your incision is closed with absorbable sutures and will have a tape bandage or skin glue over the incision. You can shower and wash your hair as usual the day after surgery. You may wash in a bathtub prior to that time if you are careful not to get your neck wet. Do not soak or scrub the incision. You might notice bruising around your incision and slight swelling above the scar when you are upright. You may have numbness of the skin around the incision. In addition, the scar may become pink and hard. This hardening will peak at about 3 weeks and may result in some tightness, which will disappear over the next 2 to 3 months. You should apply sunscreen on your incision site starting 1 month after surgery EVERY day for the first year after surgery. This will prevent a red or pink scar and give you the best cosmetic result for your scar. A daily moisturizer with sunscreen (example Oil of Olay with SPF 15) is fine.  Limitations You can start resuming normal activities as tolerated 7 days after surgery. For some patients, lifting can cause pain and stretching at the surgery site for up to 3 weeks after surgery. You should not drive or drink alcohol while taking pain medications. Most people can return to work/school 1 week after surgery, but there may be physical limitations as far as what you may do while at work. Your surgeon will review your specific limitations and release you when you are ready to return to work.  Medications ?  Pain medication can be used for pain as prescribed. Pain is expected after surgery. Your neck will be sore and pain will be worse when the neck is stretched. As the surgical site heals, pain will resolve over the course of a week. Pain medications can cause nausea, which can be prevented if you take them with food or milk. ? Bacitracin ointment can be applied to the incision once the tape is removed or the glue peels off. This prevents scabbing and itching. ? Take all of your routine medications as prescribed, unless told otherwise by your surgeon. Any medications that thin the blood should be discussed with your surgeon. ? IT IS OK TO TAKE OVER THE COUNTER PAIN MEDICATION (IBUPROFEN, NAPROXEN, or ACETAMINOPHEN) IN ADDITION TO YOUR PRESCRIBED MEDICATIONS. DO NOT TAKE ASPIRIN UNLESS CLEARED WITH YOUR SURGEON. ? Limit Acetaminophen/Tylenol to less than 4,000mg/day ? Limit Ibuprofen/Motrin to less than 3,600mg/day  Pain The main complaint following neck surgery is pain with swallowing and neck movement. Some people experience a dull ache, while others feel a sharp pain. This should not keep you from eating anything you want and will improve daily after surgery.  Reasons to call your surgeon's office ? Persistent fever over 101 F ? Bleeding from the neck incision ? Increasing neck swelling ? Pain that is not relieved by your medications ? Purulent drainage (pus) from the incision ? Redness surrounding the incision that is worsening or getting bigger ? Bleeding is possible after surgery and the most serious cases may cause   trouble breathing. Symptoms include rapid swelling in the neck, trouble breathing, red and purple discoloration of the skin over the incision. Please call doctor immediately or if trouble breathing is present, go to the closest emergency room or call 911. 

## 2023-01-09 NOTE — Progress Notes (Signed)
Postop check  Complaining of back pain but otherwise doing very well.  Heating pad has helped her back a little bit.  She is awaiting ibuprofen.  Breathing and voice are clear.  Neck looks excellent.  Dressing changed to a clean dressing.  Drain in place.  No swelling or hematoma.  Tongue looks normal with good mobility.  Stable postop.  Anticipate drain removal and discharge in the morning.

## 2023-01-10 ENCOUNTER — Other Ambulatory Visit (HOSPITAL_COMMUNITY): Payer: Self-pay

## 2023-01-10 DIAGNOSIS — Q892 Congenital malformations of other endocrine glands: Secondary | ICD-10-CM | POA: Diagnosis not present

## 2023-01-10 DIAGNOSIS — R221 Localized swelling, mass and lump, neck: Secondary | ICD-10-CM | POA: Diagnosis not present

## 2023-01-10 DIAGNOSIS — J45909 Unspecified asthma, uncomplicated: Secondary | ICD-10-CM | POA: Diagnosis not present

## 2023-01-10 DIAGNOSIS — Z79899 Other long term (current) drug therapy: Secondary | ICD-10-CM | POA: Diagnosis not present

## 2023-01-10 NOTE — Progress Notes (Signed)
Subjective: Doing well with respect to the surgical site but still having significant upper thoracic back pain.  She has a history of neck injury and cervical spinal stenosis so probably has some underlying issues.  Objective: Vital signs in last 24 hours: Temp:  [97.3 F (36.3 C)-98.5 F (36.9 C)] 98.5 F (36.9 C) (05/18 0737) Pulse Rate:  [53-82] 71 (05/18 0737) Resp:  [12-18] 16 (05/18 0737) BP: (100-161)/(53-94) 115/74 (05/18 0737) SpO2:  [95 %-98 %] 97 % (05/18 0737) Weight:  [70.3 kg] 70.3 kg (05/17 0847) Weight change:  Last BM Date : 01/08/23  Intake/Output from previous day: 05/17 0701 - 05/18 0700 In: 2310 [P.O.:960; I.V.:1300; IV Piggyback:50] Out: 20 [Blood:20] Intake/Output this shift: No intake/output data recorded.  PHYSICAL EXAM: Awake and alert.  Voice is normal.  Neck looks excellent.  Drain removed.  Fresh dressing applied.  Lab Results: No results for input(s): "WBC", "HGB", "HCT", "PLT" in the last 72 hours. BMET No results for input(s): "NA", "K", "CL", "CO2", "GLUCOSE", "BUN", "CREATININE", "CALCIUM" in the last 72 hours.  Studies/Results: No results found.  Medications: I have reviewed the patient's current medications.  Assessment/Plan: Stable postop.  Discharge home.  Follow-up as scheduled.  LOS: 0 days       Sherry Rodriguez 01/10/2023, 8:16 AM

## 2023-01-11 NOTE — Discharge Summary (Signed)
Physician Discharge Summary  Patient ID: Sherry Rodriguez MRN: [id number] DOB/AGE: 04-18-1986 37 y.o.  Admit date: 01/09/2023 Discharge date: 01/11/2023  Admission Diagnoses: Thyroglossal duct cyst  Discharge Diagnoses:  Principal Problem:   Neck mass   Discharged Condition: good  Hospital Course: No complications  Consults: none  Significant Diagnostic Studies: none  Treatments: surgery: Sistrunk procedure  Discharge Exam: Blood pressure 115/74, pulse 71, temperature 98.5 F (36.9 C), temperature source Oral, resp. rate 16, height 5\' 9"  (1.753 m), weight 70.3 kg, last menstrual period 12/21/2022, SpO2 97 %. PHYSICAL EXAM: Awake and alert.  Neck looks excellent.  Drain removed.  Disposition: Discharge disposition: 01-Home or Self Care       Discharge Instructions     Diet - low sodium heart healthy   Complete by: As directed    Increase activity slowly   Complete by: As directed       Allergies as of 01/10/2023       Reactions   Tamiflu [oseltamivir Phosphate] Other (See Comments)   Caused sleepwalking        Medication List     TAKE these medications    albuterol 108 (90 Base) MCG/ACT inhaler Commonly known as: VENTOLIN HFA Inhale 1 puff into the lungs as needed for wheezing or shortness of breath.   ALPRAZolam 0.25 MG tablet Commonly known as: XANAX Take 1 tablet (0.25 mg total) by mouth 2 (two) times daily as needed for anxiety.   CALCIUM 600 + D PO Take 1 tablet by mouth daily.   ferrous sulfate 325 (65 FE) MG tablet Take 325 mg by mouth See admin instructions. Take 325 mg daily for 3 days around cycle per month   HYDROcodone-acetaminophen 5-325 MG tablet Commonly known as: NORCO/VICODIN Take 1 tablet by mouth every 6 (six) hours as needed for up to 3 days for moderate pain.   hydrOXYzine 25 MG tablet Commonly known as: ATARAX Take 1 tablet (25 mg total) by mouth 3 (three) times daily as needed. What changed:  when to take this reasons  to take this   K2-D3 5000 5000-90 UNIT-MCG Caps Take 1 capsule by mouth daily.   loratadine 10 MG tablet Commonly known as: CLARITIN Take 10 mg by mouth daily.   MAGNESIUM PO Take 1 tablet by mouth daily.   MIDOL PO Take 2 tablets by mouth daily as needed (cramping/pain).   multivitamin with minerals Tabs tablet Take 1 tablet by mouth daily.   OVER THE COUNTER MEDICATION Take 1 capsule by mouth daily. doterra on guard   TURMERIC PO Take 1 tablet by mouth daily.        Follow-up Information     Skotnicki, Meghan A, DO Follow up on 01/22/2023.   Specialty: Otolaryngology Why: Follow up as scheduled on 01/22/2023 Contact information: [address] [place] [address] [place] [address] [phone]                 Signed: Serena Colonel 01/11/2023, 8:38 AM

## 2023-01-11 NOTE — Anesthesia Postprocedure Evaluation (Signed)
Anesthesia Post Note  Patient: Sherry Rodriguez  Procedure(s) Performed: REMOVAL OF THYROGLOSSAL DUCT CYST WITH PORTION OF HYOID BONE (Bilateral)     Patient location during evaluation: PACU Anesthesia Type: General Level of consciousness: awake and alert Pain management: pain level controlled Vital Signs Assessment: post-procedure vital signs reviewed and stable Respiratory status: spontaneous breathing, nonlabored ventilation and respiratory function stable Cardiovascular status: blood pressure returned to baseline Postop Assessment: no apparent nausea or vomiting Anesthetic complications: no   No notable events documented.         Shanda Howells

## 2023-01-12 ENCOUNTER — Encounter (HOSPITAL_COMMUNITY): Payer: Self-pay | Admitting: Otolaryngology

## 2023-01-12 LAB — SURGICAL PATHOLOGY

## 2023-01-13 NOTE — Op Note (Signed)
OPERATIVE NOTE  Sherry Rodriguez Date/Time of Admission: 01/09/2023  8:34 AM  CSN: [id number];MRN:[id number] Attending Provider: Jameire Kouba A, DO Room/Bed: MCPO/NONE DOB: 10/19/1985 Age: 37 y.o.   Pre-Op Diagnosis: Localized swelling, mass and lump, neck  Post-Op Diagnosis: Localized swelling, mass and lump, neck  Procedure: Procedure(s): REMOVAL OF THYROGLOSSAL DUCT CYST WITH EXCISION OF ASSOCIATED TRACT AND PORTION OF HYOID BONE (SISTRUNK PROCEDURE)  Anesthesia: General  Surgeon(s): Aida Lemaire A Orvel Cutsforth, DO  Staff: Circulator: Everhart, Rhonda B, RN Relief Circulator: Steelman, Craig N, RN Scrub Person: Leggio, Rebecca K RN First Assistant: Anderson, Peyton L, RN  Implants: * No implants in log *  Specimens: ID Type Source Tests Collected by Time Destination  1 : MIDLINE NECK CYST WITH PORTION OF HYOID BONE Tissue PATH Soft tissue resection SURGICAL PATHOLOGY Idona Stach A, DO 01/09/2023 1226     Complications: None  EBL: <10 ML  Condition: stable  Operative Findings:  Large, midline cystic neck mass with dense adherence to hyoid, and no evidence of intraoral extension.   Description of Operation: The patient was identified in the holding room, and informed consent was obtained.  The patient was then brought to the operating room and placed supine on the table. General anesthesia was induced and the patient was orotracheally intubated. A prep verification and time out were performed. Preoperative antibiotics were administered.  A shoulder roll was placed. The planned neck incision was marked and  infiltrated with 1% lidocaine with 1:100,000 epinephrine.  The patient was then prepped and draped in the standard sterile fashion.   The incision was made through the skin and subcutaneous tissues with a 15 blade.  The platysma was divided and flaps elevated superiorly and inferiorly.  The neck mass was visualized and palpapated.  It was dissected with a  combination of blunt and sharp dissection.  The cyst was released inferiorly leaving a cuff a strap muscle attached to the cyst where adherent.  The thyroid notch of the thyroid cartilage was identified and freed from the cyst. The dissection was carried superiorly to the hyoid bone.  Bovie cautery was used to detach the musculature from the insertion on the superior middle third of the hyoid bone.   The central third of the hyoid bone was removed and dissection was carried superiorly, to remove a cuff of central tongue base musculature.  The cyst, strap muscles, hyoid and a track of tongue musculature were removed en bloc and passed off the field as specimen. The wound bed was copiously irrigated and meticulous hemostasis was achieved.    A 1/4 inch penrose drain was placed at the tongue base defect and sutured in place with a 3-0 silk.   The wound was then closed in layers.  The strap muscles were reapproximated in the midline with interrupted 4-0 vicryl.  The platysma/deep dermal layer was closed with interrupted 4-0 Vicryl.  The skin was closed with 5-0 subcuticular Monocryl.  Dermabond was placed over the incision.   The patient was returned to the care of the anesthesia staff, awakened, and transferred to recovery room in good condition.   Oluwaseyi Tull A Tilley Faeth, DO [facility]  01/09/2023    

## 2023-01-26 DIAGNOSIS — Q892 Congenital malformations of other endocrine glands: Secondary | ICD-10-CM | POA: Diagnosis not present

## 2023-03-26 NOTE — Progress Notes (Signed)
I, Stevenson Clinch, CMA acting as a scribe for Clementeen Graham, MD.  Sherry Rodriguez is a 37 y.o. female who presents to [facility] today for f/u neck pain. Pt was last seen by Dr. Denyse Amass on 01/09/22 and was advised to cont PT, completing 9 visits (d/c 02/03/22. Of note, pt saw her PCP for a mass on the L-side of her neck, US revealed a thyroglossal duct cyst measuring 3.0 x 1.8 x 2.2 cm which was surgically removed on May 17th.   Today, pt reports significant improvement of s since last visit. Will have occasional "bad neck day", typically a result of overdoing it at the gym.  Review CT today.   Dx testing: 10/06/22 Neck soft tissue CT  09/01/22 US soft tissue head & neck  5/31/23US soft tissue head & neck  01/09/22 C-spine XR  Pertinent review of systems: No fevers or chills  Relevant historical information: Thyroglossal duct cyst status post removal and may   Exam:  BP 110/78   Pulse 77   Ht 5\' 9"  (1.753 m)   Wt 158 lb (71.7 kg)   SpO2 99%   BMI 23.33 kg/m  General: Well Developed, well nourished, and in no acute distress.   MSK: C-spine normal appearing well-appearing anterior scar.  Normal cervical motion.  Upper shin strength is intact.    Lab and Radiology Results  EXAM: CT NECK WITH CONTRAST   TECHNIQUE: Multidetector CT imaging of the neck was performed using the standard protocol following the bolus administration of intravenous contrast.   RADIATION DOSE REDUCTION: This exam was performed according to the departmental dose-optimization program which includes automated exposure control, adjustment of the mA and/or kV according to patient size and/or use of iterative reconstruction technique.   CONTRAST:  75mL ISOVUE-300 IOPAMIDOL (ISOVUE-300) INJECTION 61%   COMPARISON:  Neck ultrasound 09/01/2022. Neck ultrasound 01/22/2022.   FINDINGS: Pharynx and larynx: Streak/beam hardening artifact partially obscures the oral cavity. No  appreciable swelling or mass within the oral cavity, pharynx or larynx.   Salivary glands: No inflammation, mass, or stone.   Thyroid: Unremarkable.   Lymph nodes: No pathologically enlarged lymph nodes identified within the neck.   Vascular: The major vascular structures of the neck are patent.   Limited intracranial: No evidence of an acute intracranial abnormality within the field of view.   Visualized orbits: Incompletely imaged. No orbital mass or acute orbital finding at the imaged levels.   Mastoids and visualized paranasal sinuses: The frontal sinuses, and portions of the ethmoid sinuses, are excluded from the field of view. No significant paranasal sinus disease or mastoid effusion at the imaged levels.   Skeleton: Cervical spondylosis. Most notably at C5-C6, a disc bulge contributes to at least moderate spinal canal stenosis.   Upper chest: No consolidation within the imaged lung apices.   Other: There is a 2.5 x 1.8 x 2.7 cm cystic lesion within the ventral midline neck, partly embedded within the ventral strap muscles (for instance as seen on series 3, image 59) (series 6, image 64). The lesion is located both posterior and inferior to the hyoid bone.   IMPRESSION: 2.5 x 1.8 x 2.7 cm cystic lesion within the midline ventral neck, partly embedded within the ventral strap muscles. The lesion is located both posterior and inferior to the hyoid bone. Given the location and imaging appearance, this is favored to reflect a thyroglossal duct cyst.   Cervical spondylosis. Most notably at C5-C6, a disc  bulge contributes to at least moderate spinal canal stenosis. A cervical spine MRI should be considered for further evaluation.     Electronically Signed   By: Jackey Loge D.O.   On: 10/07/2022 10:37 I, Clementeen Graham, personally (independently) visualized and performed the interpretation of the images attached in this note.     Assessment and Plan: 37 y.o. female  with chronic neck pain.  Clinically doing quite well with minimal to most of the time absent symptoms.  She will occasionally have a flareup of neck pain but not radicular symptoms. She very occasionally uses cyclobenzaprine.  Her current prescription has expired so we can refill it.  There is some concern for cervical spinal stenosis on her CT scan.  She does not have radicular symptoms so I do not think we need to do an MRI at this time.  However if symptoms worsen or change an MRI would be a good idea and can order it in the future.  For now watchful waiting and check back as needed.   PDMP not reviewed this encounter. No orders of the defined types were placed in this encounter.  Meds ordered this encounter  Medications   cyclobenzaprine (FLEXERIL) 5 MG tablet    Sig: Take 1-2 tablets (5-10 mg total) by mouth at bedtime as needed for muscle spasms.    Dispense:  30 tablet    Refill:  12     Discussed warning signs or symptoms. Please see discharge instructions. Patient expresses understanding.   The above documentation has been reviewed and is accurate and complete Clementeen Graham, M.D.

## 2023-03-27 ENCOUNTER — Encounter: Payer: Self-pay | Admitting: Family Medicine

## 2023-03-27 ENCOUNTER — Ambulatory Visit: Payer: BC Managed Care – PPO | Admitting: Family Medicine

## 2023-03-27 VITALS — BP 110/78 | HR 77 | Ht 69.0 in | Wt 158.0 lb

## 2023-03-27 DIAGNOSIS — M542 Cervicalgia: Secondary | ICD-10-CM | POA: Diagnosis not present

## 2023-03-27 MED ORDER — CYCLOBENZAPRINE HCL 5 MG PO TABS: 5.0000 mg | ORAL_TABLET | Freq: Every evening | ORAL | 12 refills | Status: DC | PRN

## 2023-03-27 NOTE — Patient Instructions (Addendum)
Thank you for coming in today.   Continue to stay fit and active.   I will refill cyclobenzaprine.   Recheck as needed.   Antiinflammatory diet is a good idea.   Krill oil may help some.

## 2023-04-02 ENCOUNTER — Encounter: Payer: Self-pay | Admitting: Family Medicine

## 2023-04-22 DIAGNOSIS — L821 Other seborrheic keratosis: Secondary | ICD-10-CM | POA: Diagnosis not present

## 2023-04-22 DIAGNOSIS — D225 Melanocytic nevi of trunk: Secondary | ICD-10-CM | POA: Diagnosis not present

## 2023-05-29 DIAGNOSIS — N3001 Acute cystitis with hematuria: Secondary | ICD-10-CM | POA: Diagnosis not present

## 2023-06-08 ENCOUNTER — Encounter: Payer: BC Managed Care – PPO | Admitting: Family Medicine

## 2023-06-10 ENCOUNTER — Telehealth: Payer: Self-pay

## 2023-06-10 ENCOUNTER — Encounter: Payer: Self-pay | Admitting: Family Medicine

## 2023-06-10 ENCOUNTER — Ambulatory Visit (INDEPENDENT_AMBULATORY_CARE_PROVIDER_SITE_OTHER): Payer: BC Managed Care – PPO | Admitting: Family Medicine

## 2023-06-10 ENCOUNTER — Other Ambulatory Visit (HOSPITAL_COMMUNITY)
Admission: RE | Admit: 2023-06-10 | Discharge: 2023-06-10 | Disposition: A | Discharge status: Home / Self Care | Payer: BC Managed Care – PPO | Source: Ambulatory Visit | Attending: Family Medicine | Admitting: Family Medicine

## 2023-06-10 VITALS — BP 122/64 | HR 72 | Temp 98.0°F | Ht 68.5 in | Wt 154.2 lb

## 2023-06-10 DIAGNOSIS — Z0000 Encounter for general adult medical examination without abnormal findings: Secondary | ICD-10-CM | POA: Diagnosis not present

## 2023-06-10 DIAGNOSIS — E7800 Pure hypercholesterolemia, unspecified: Secondary | ICD-10-CM | POA: Diagnosis not present

## 2023-06-10 DIAGNOSIS — Z114 Encounter for screening for human immunodeficiency virus [HIV]: Secondary | ICD-10-CM

## 2023-06-10 DIAGNOSIS — Z1159 Encounter for screening for other viral diseases: Secondary | ICD-10-CM

## 2023-06-10 DIAGNOSIS — R309 Painful micturition, unspecified: Secondary | ICD-10-CM

## 2023-06-10 DIAGNOSIS — E559 Vitamin D deficiency, unspecified: Secondary | ICD-10-CM | POA: Diagnosis not present

## 2023-06-10 DIAGNOSIS — Z124 Encounter for screening for malignant neoplasm of cervix: Secondary | ICD-10-CM | POA: Insufficient documentation

## 2023-06-10 DIAGNOSIS — F419 Anxiety disorder, unspecified: Secondary | ICD-10-CM

## 2023-06-10 LAB — POCT URINALYSIS DIPSTICK
Bilirubin, UA: NEGATIVE
Blood, UA: NEGATIVE
Glucose, UA: NEGATIVE
Ketones, UA: NEGATIVE
Leukocytes, UA: NEGATIVE
Nitrite, UA: NEGATIVE
Protein, UA: NEGATIVE
Spec Grav, UA: 1.01 (ref 1.010–1.025)
Urobilinogen, UA: 0.2 U/dL
pH, UA: 7 (ref 5.0–8.0)

## 2023-06-10 MED ORDER — ALPRAZOLAM 0.25 MG PO TABS
0.2500 mg | ORAL_TABLET | Freq: Two times a day (BID) | ORAL | 3 refills | Status: DC | PRN
Start: 2023-06-10 — End: 2024-02-23

## 2023-06-10 MED ORDER — ALBUTEROL SULFATE HFA 108 (90 BASE) MCG/ACT IN AERS: 1.0000 | INHALATION_SPRAY | RESPIRATORY_TRACT | 3 refills | Status: AC | PRN

## 2023-06-10 NOTE — Telephone Encounter (Signed)
-----   Message from Neena Rhymes sent at 06/10/2023  3:47 PM EDT ----- No evidence of any residual UTI.  This is good news.  Continue to drink lots of water to flush through your system.  If you continue to have symptoms, please let me know

## 2023-06-10 NOTE — Patient Instructions (Signed)
Follow up in 1 year or as needed We'll notify you of your lab results and make any changes if needed Keep up the good work on healthy diet and regular exercise- you look great! Call with any questions or concerns Stay Safe!  Stay Healthy! Happy Fall!!!

## 2023-06-10 NOTE — Progress Notes (Unsigned)
   Subjective:    Patient ID: Sherry Rodriguez, female    DOB: March 12, 1986, 37 y.o.   MRN: [id number]  HPI CPE- UTD on Tdap.  Due for pap.  Declines flu.  Patient Care Team    Relationship Specialty Notifications Start End  Sheliah Hatch, MD PCP - General Family Medicine  10/13/16   [facility]    06/10/23      Health Maintenance  Topic Date Due  . HIV Screening  Never done  . Hepatitis C Screening  Never done  . Cervical Cancer Screening (HPV/Pap Cotest)  10/28/2019  . INFLUENZA VACCINE  Never done  . COVID-19 Vaccine (1 - 2023-24 season) Never done  . DTaP/Tdap/Td (2 - Td or Tdap) 10/13/2026  . HPV VACCINES  Aged Out      Review of Systems Patient reports no vision/ hearing changes, adenopathy,fever, weight change,  persistant/recurrent hoarseness , swallowing issues, chest pain, palpitations, edema, persistant/recurrent cough, hemoptysis, dyspnea (rest/exertional/paroxysmal nocturnal), gastrointestinal bleeding (melena, rectal bleeding), abdominal pain, significant heartburn, bowel changes, GU symptoms (dysuria, hematuria, incontinence), Gyn symptoms (abnormal  bleeding, pain),  syncope, focal weakness, memory loss, numbness & tingling, skin/hair/nail changes, abnormal bruising or bleeding, anxiety, or depression.     Objective:   Physical Exam        Assessment & Plan:

## 2023-06-11 ENCOUNTER — Ambulatory Visit: Payer: BC Managed Care – PPO | Admitting: Family Medicine

## 2023-06-11 LAB — HEPATIC FUNCTION PANEL
ALT: 14 U/L (ref 0–35)
AST: 20 U/L (ref 0–37)
Albumin: 4.7 g/dL (ref 3.5–5.2)
Alkaline Phosphatase: 53 U/L (ref 39–117)
Bilirubin, Direct: 0.1 mg/dL (ref 0.0–0.3)
Total Bilirubin: 0.6 mg/dL (ref 0.2–1.2)
Total Protein: 7.7 g/dL (ref 6.0–8.3)

## 2023-06-11 LAB — HEPATITIS C ANTIBODY: Hepatitis C Ab: NONREACTIVE

## 2023-06-11 LAB — LIPID PANEL
Cholesterol: 183 mg/dL (ref 0–200)
HDL: 76.6 mg/dL (ref >39.00)
LDL Cholesterol: 99 mg/dL (ref 0–99)
NonHDL: 106.45
Total CHOL/HDL Ratio: 2
Triglycerides: 35 mg/dL (ref 0.0–149.0)
VLDL: 7 mg/dL (ref 0.0–40.0)

## 2023-06-11 LAB — CBC WITH DIFFERENTIAL/PLATELET
Basophils Absolute: 0.1 10*3/uL (ref 0.0–0.1)
Basophils Relative: 0.8 % (ref 0.0–3.0)
Eosinophils Absolute: 0 10*3/uL (ref 0.0–0.7)
Eosinophils Relative: 0.4 % (ref 0.0–5.0)
HCT: 40.9 % (ref 36.0–46.0)
Hemoglobin: 13.3 g/dL (ref 12.0–15.0)
Lymphocytes Relative: 23.7 % (ref 12.0–46.0)
Lymphs Abs: 1.7 10*3/uL (ref 0.7–4.0)
MCHC: 32.4 g/dL (ref 30.0–36.0)
MCV: 93.4 fL (ref 78.0–100.0)
Monocytes Absolute: 0.6 10*3/uL (ref 0.1–1.0)
Monocytes Relative: 8.5 % (ref 3.0–12.0)
Neutro Abs: 4.8 10*3/uL (ref 1.4–7.7)
Neutrophils Relative %: 66.6 % (ref 43.0–77.0)
Platelets: 275 10*3/uL (ref 150.0–400.0)
RBC: 4.38 Mil/uL (ref 3.87–5.11)
RDW: 14.4 % (ref 11.5–15.5)
WBC: 7.2 10*3/uL (ref 4.0–10.5)

## 2023-06-11 LAB — TSH: TSH: 0.85 u[IU]/mL (ref 0.35–5.50)

## 2023-06-11 LAB — BASIC METABOLIC PANEL
BUN: 9 mg/dL (ref 6–23)
CO2: 29 meq/L (ref 19–32)
Calcium: 9.7 mg/dL (ref 8.4–10.5)
Chloride: 101 meq/L (ref 96–112)
Creatinine, Ser: 0.83 mg/dL (ref 0.40–1.20)
GFR: 88.68 mL/min (ref >60.00)
Glucose, Bld: 84 mg/dL (ref 70–99)
Potassium: 4 meq/L (ref 3.5–5.1)
Sodium: 138 meq/L (ref 135–145)

## 2023-06-11 LAB — VITAMIN D 25 HYDROXY (VIT D DEFICIENCY, FRACTURES): VITD: 28.41 ng/mL — ABNORMAL LOW (ref 30.00–100.00)

## 2023-06-11 LAB — HIV ANTIBODY (ROUTINE TESTING W REFLEX): HIV 1&2 Ab, 4th Generation: NONREACTIVE

## 2023-06-11 NOTE — Assessment & Plan Note (Signed)
Pt's PE WNL.  UTD on Tdap.  Pap done today.  Declines flu.  Will start mammogram early next year.  Check labs.  Anticipatory guidance provided.

## 2023-06-12 ENCOUNTER — Other Ambulatory Visit: Payer: Self-pay

## 2023-06-12 ENCOUNTER — Telehealth: Payer: Self-pay

## 2023-06-12 DIAGNOSIS — E559 Vitamin D deficiency, unspecified: Secondary | ICD-10-CM

## 2023-06-12 MED ORDER — VITAMIN D (ERGOCALCIFEROL) 1.25 MG (50000 UNIT) PO CAPS
50000.0000 [IU] | ORAL_CAPSULE | ORAL | 0 refills | Status: AC
Start: 2023-06-12 — End: ?

## 2023-06-12 NOTE — Telephone Encounter (Signed)
-----   Message from Neena Rhymes sent at 06/11/2023  4:07 PM EDT ----- Labs look great w/ exception of low Vit D.  Based on this, we need to start 50,000 units weekly x12 weeks in addition to daily OTC supplement of at least 2000 units.

## 2023-06-16 LAB — CYTOLOGY - PAP
Comment: NEGATIVE
Comment: NEGATIVE
Comment: NEGATIVE
Diagnosis: UNDETERMINED — AB
HPV 16: NEGATIVE
HPV 18 / 45: NEGATIVE
High risk HPV: POSITIVE — AB

## 2023-06-17 ENCOUNTER — Telehealth: Payer: Self-pay

## 2023-06-17 NOTE — Telephone Encounter (Signed)
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  Based on this, we will refer you to GYN for a complete evaluation and any potential follow up or treatment (dx ASCUS, high risk HPV).  Please let me know if you have a GYN preference for this referral so we can get things started.

## 2023-06-18 ENCOUNTER — Other Ambulatory Visit: Payer: Self-pay

## 2023-06-18 ENCOUNTER — Telehealth: Payer: Self-pay | Admitting: Family Medicine

## 2023-06-18 DIAGNOSIS — B977 Papillomavirus as the cause of diseases classified elsewhere: Secondary | ICD-10-CM

## 2023-06-18 NOTE — Telephone Encounter (Signed)
Placed referral. Left vm for pt.

## 2023-06-18 NOTE — Telephone Encounter (Signed)
Please advise if this is okay to place this referral or if pt needs a more recent visit

## 2023-06-18 NOTE — Telephone Encounter (Signed)
Please place GYN referral to Dr Belva Agee ([facility]) dx abnormal pap, high risk HPV

## 2023-06-18 NOTE — Telephone Encounter (Signed)
Caller name: AAYAT BOCKOVER  On DPR?: Yes  Call back number: [phone] (mobile)  Provider they see: Sheliah Hatch, MD  Reason for call:   Pt checked and Dr Caprice Kluver is listed in her network and she'd like a referral to her office.

## 2023-07-09 ENCOUNTER — Encounter: Payer: Self-pay | Admitting: Family Medicine

## 2023-07-14 DIAGNOSIS — Z3040 Encounter for surveillance of contraceptives, unspecified: Secondary | ICD-10-CM | POA: Diagnosis not present

## 2023-07-14 DIAGNOSIS — N940 Mittelschmerz: Secondary | ICD-10-CM | POA: Diagnosis not present

## 2023-07-14 DIAGNOSIS — R87619 Unspecified abnormal cytological findings in specimens from cervix uteri: Secondary | ICD-10-CM | POA: Diagnosis not present

## 2023-07-28 DIAGNOSIS — R87810 Cervical high risk human papillomavirus (HPV) DNA test positive: Secondary | ICD-10-CM | POA: Diagnosis not present

## 2023-07-28 DIAGNOSIS — Z3202 Encounter for pregnancy test, result negative: Secondary | ICD-10-CM | POA: Diagnosis not present

## 2023-07-28 DIAGNOSIS — N870 Mild cervical dysplasia: Secondary | ICD-10-CM | POA: Diagnosis not present

## 2023-10-08 IMAGING — DX DG CERVICAL SPINE 2 OR 3 VIEWS
3 series · 3 of 3 positions shown · non-contrast
Comparison: None Available.

CLINICAL DATA: Cervical radiculopathy.

EXAM:
CERVICAL SPINE - 2-3 VIEW

[c-spine lat]
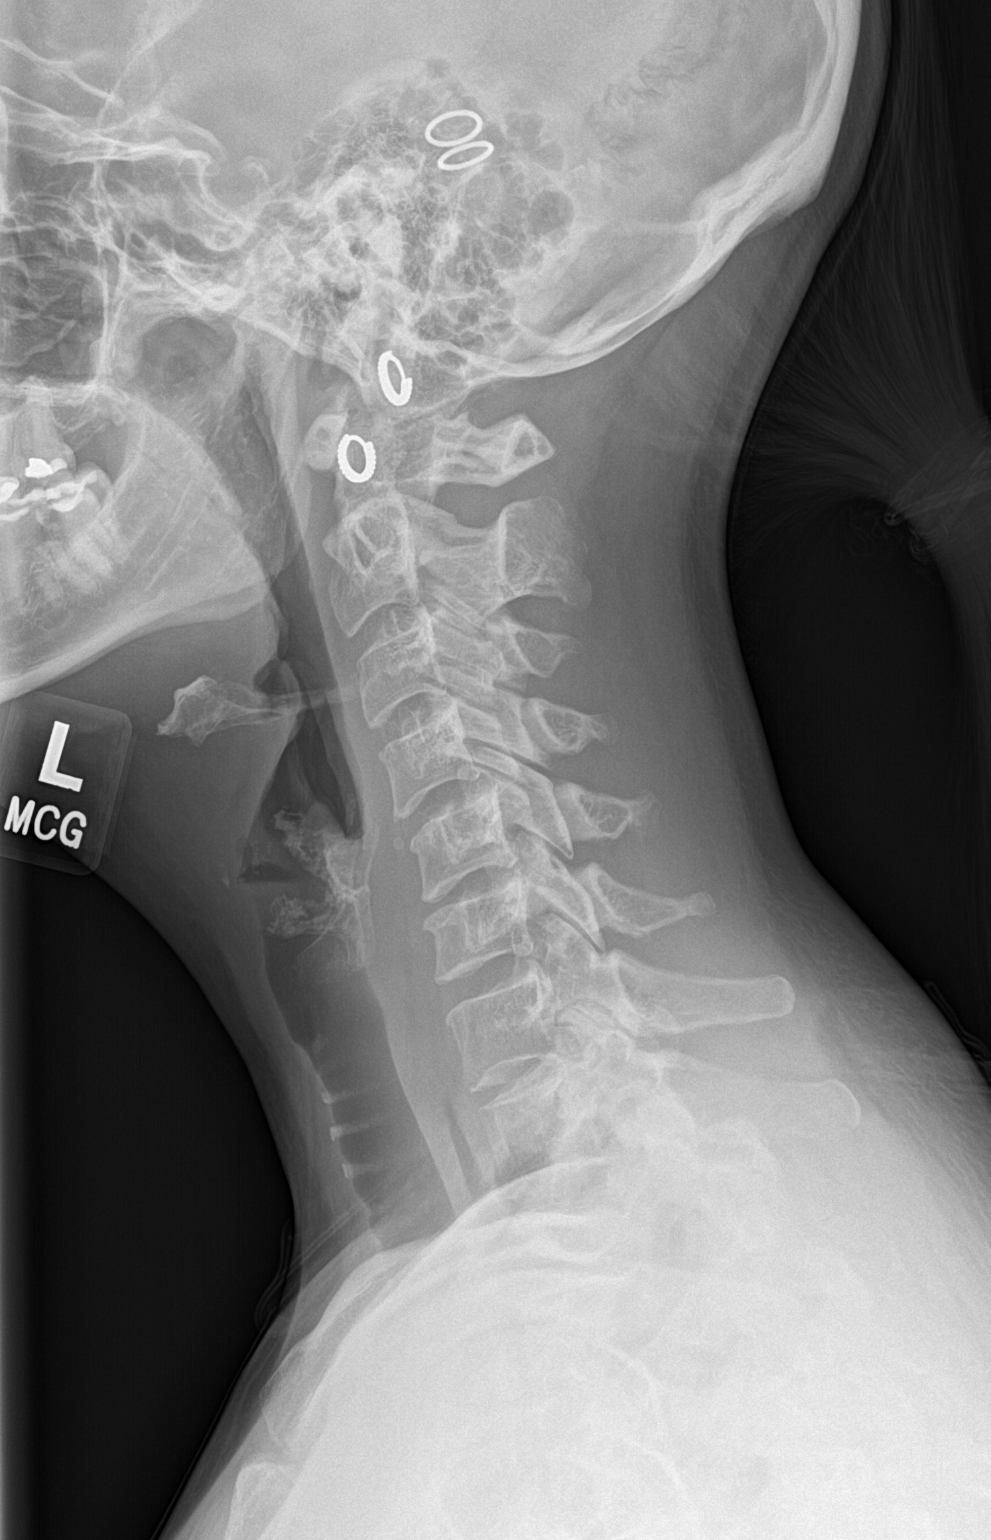

[c-spine ap]
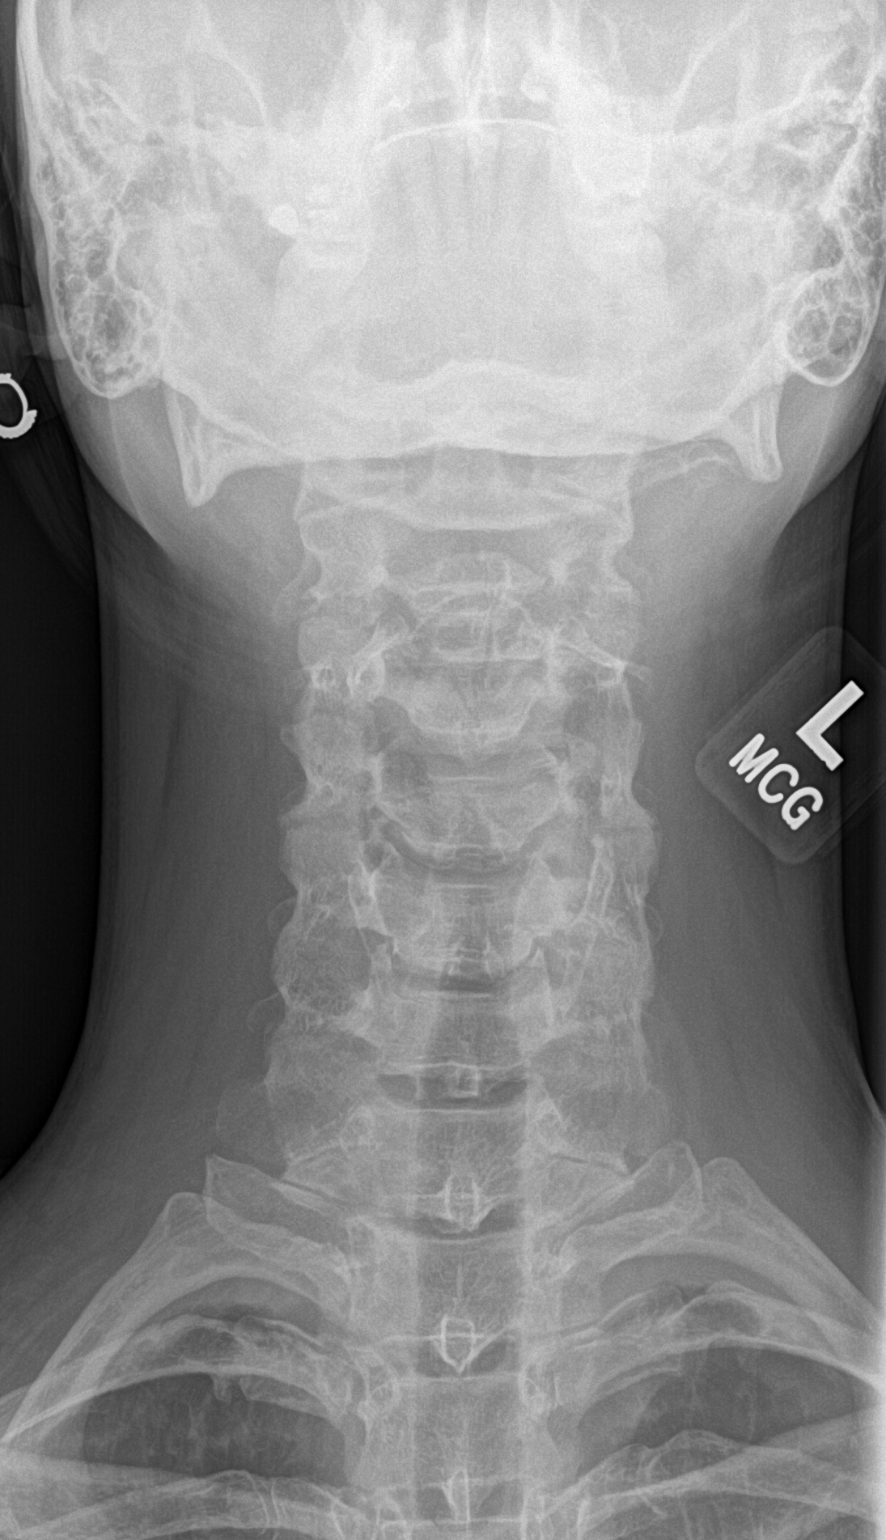

[c-spine open mouth]
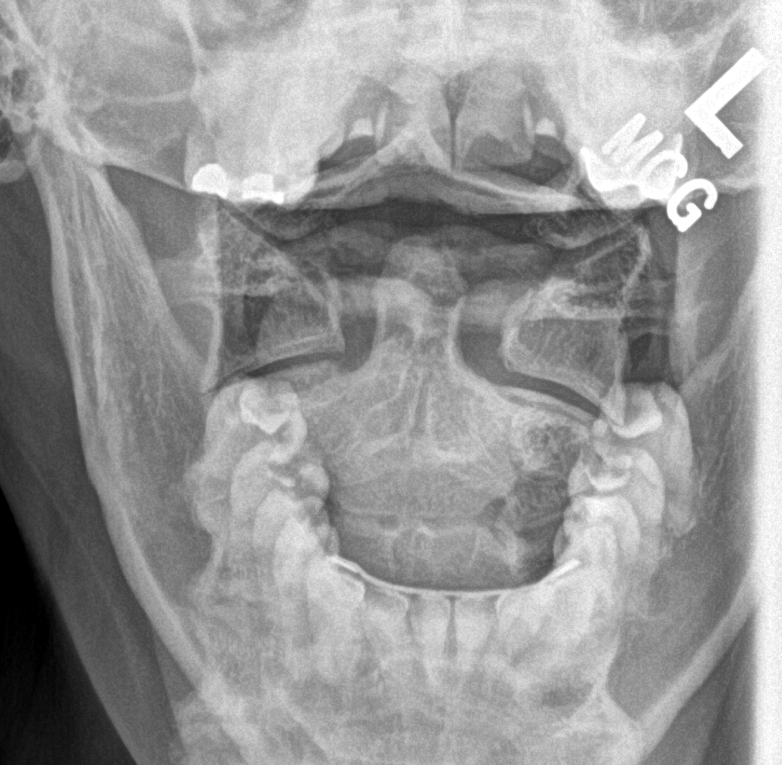

[3 of 3 positions shown; findings below may reference images not displayed]

FINDINGS: Grade 1 anterolisthesis at C4-5. Vertebral body heights and disc
spaces are preserved. Normal prevertebral soft tissues. The dens is
intact.
IMPRESSION: Slight grade 1 C4-5 anterolisthesis.

## 2023-10-21 IMAGING — US US SOFT TISSUE HEAD/NECK
1 series · 14 of 21 positions shown · non-contrast
Comparison: None Available.

CLINICAL DATA: Neck mass.  Lesion just below the chin.

EXAM:
ULTRASOUND OF HEAD/NECK SOFT TISSUES
TECHNIQUE: Ultrasound examination of the head and neck soft tissues was
performed in the area of clinical concern.

[Series 1: us soft tissue head/neck · 0.05mm/px · 21 acquisitions, 14 frames shown]
[im 1/21]
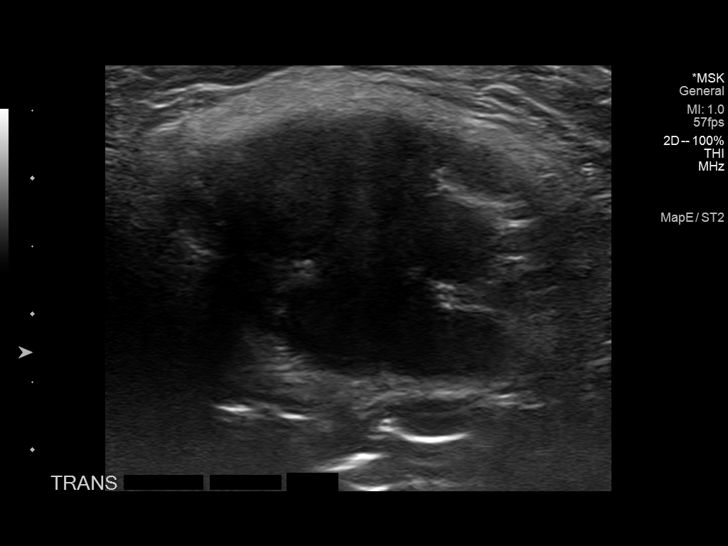
[im 3/21]
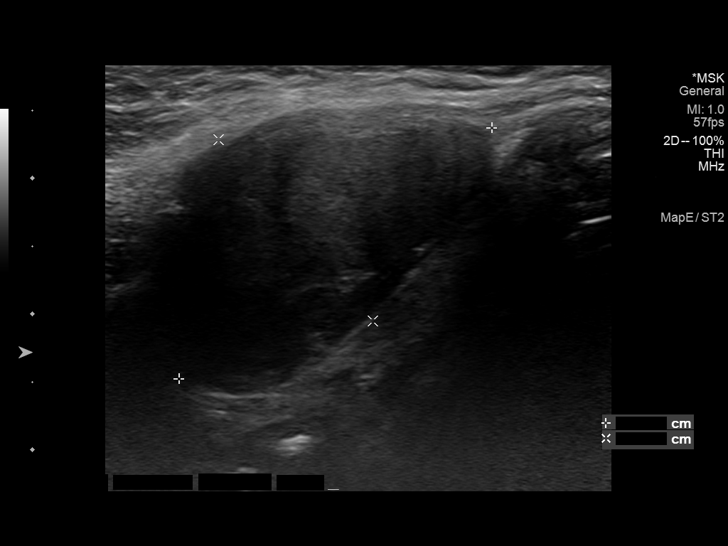
[im 4/21]
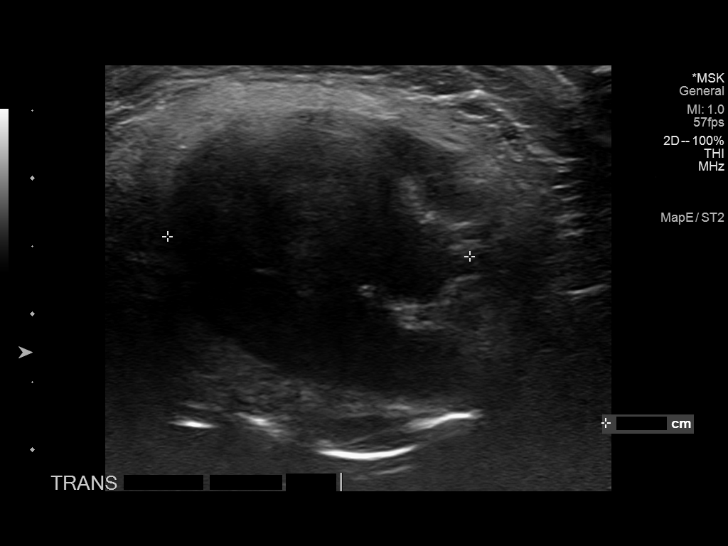
[im 6/21]
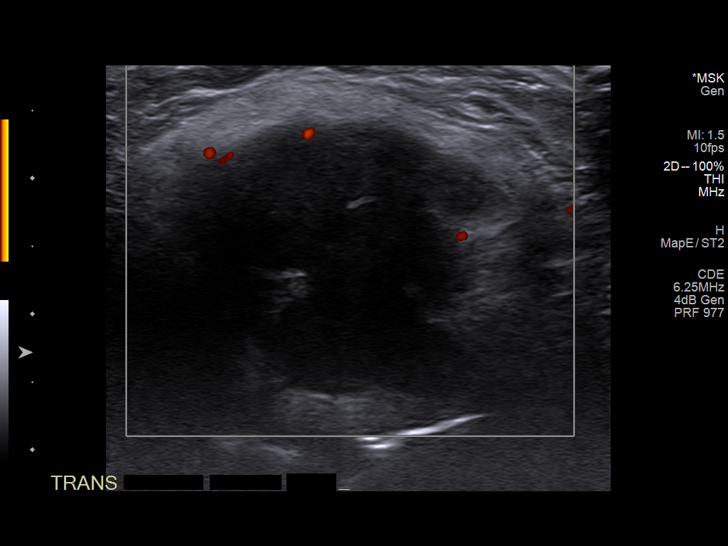
[im 7/21]
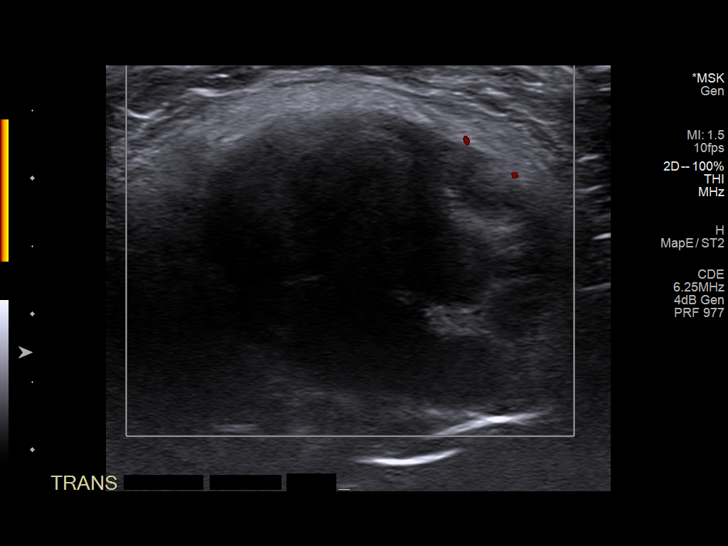
[im 9/21]
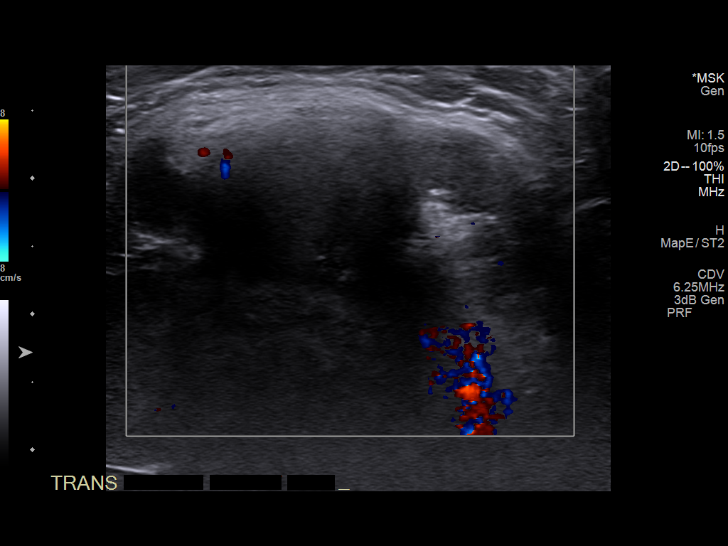
[im 10/21]
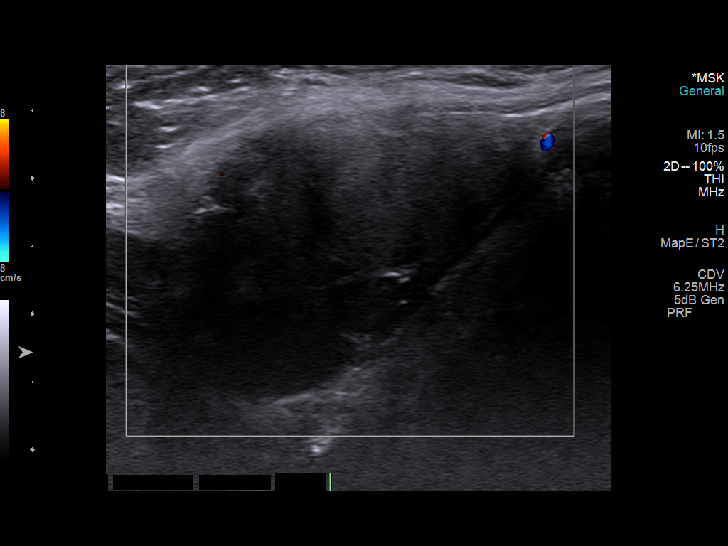
[im 12/21]
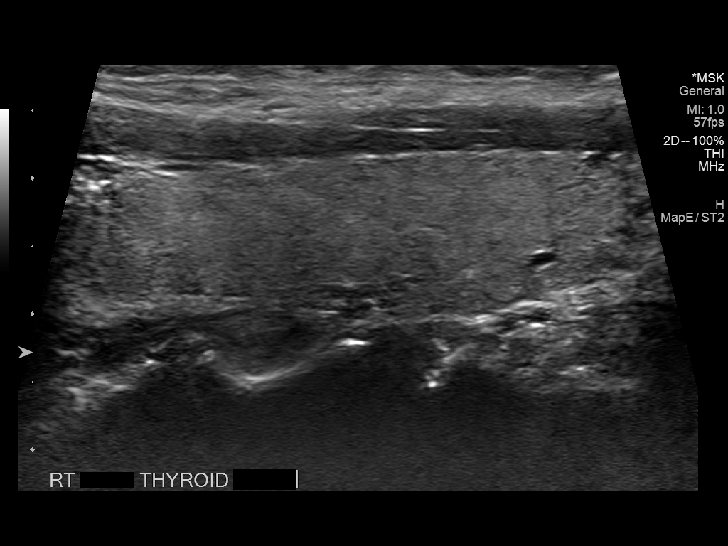
[im 13/21]
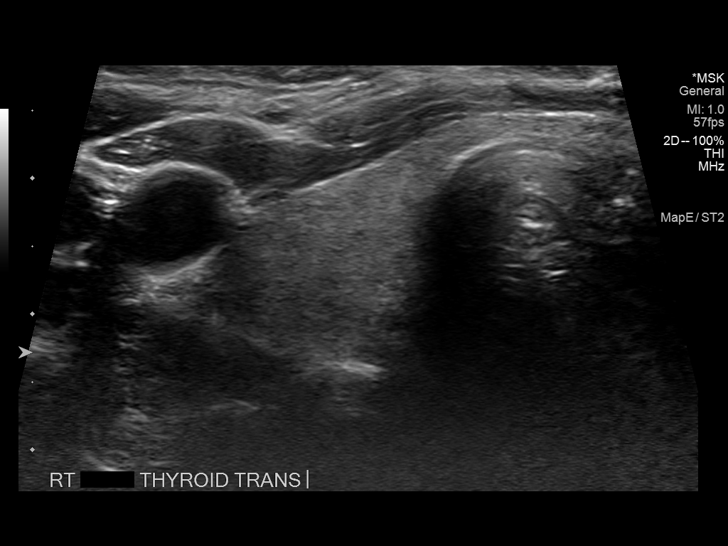
[im 15/21]
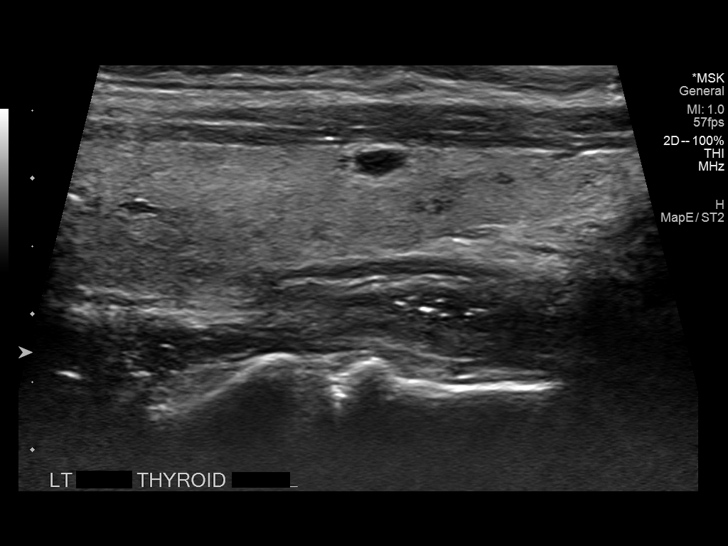
[im 16/21]
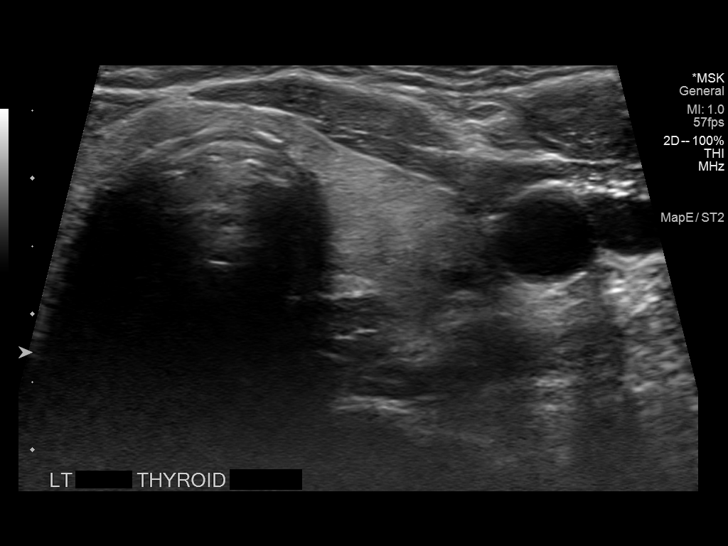
[im 18/21]
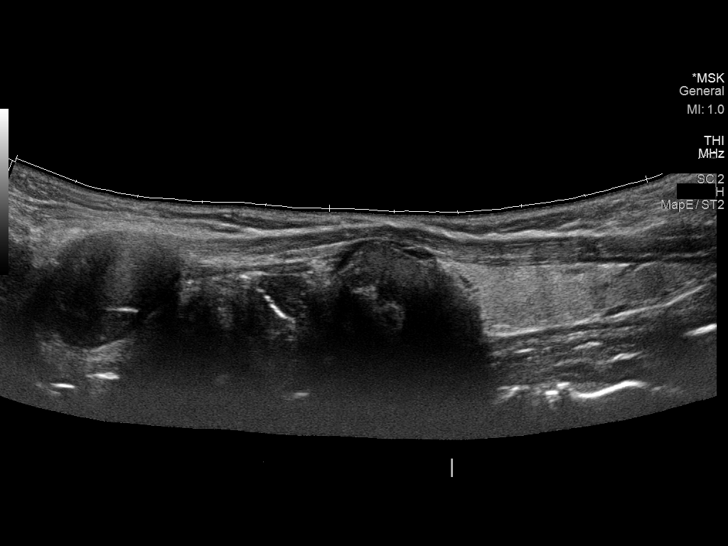
[im 19/21]
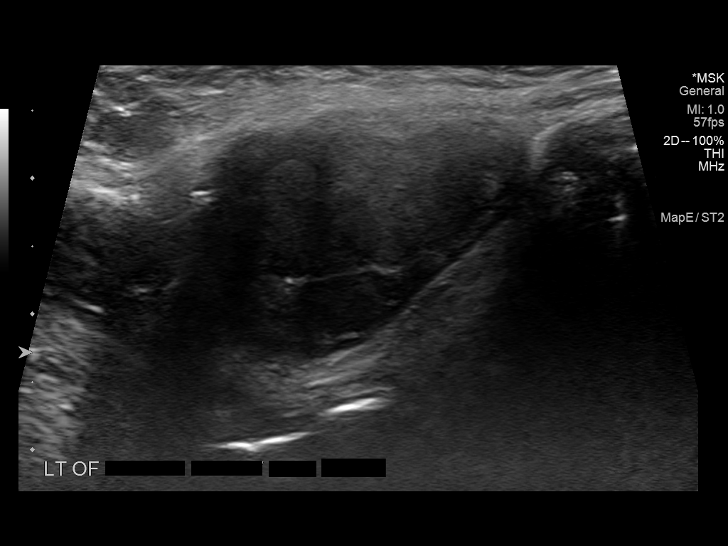
[im 21/21]
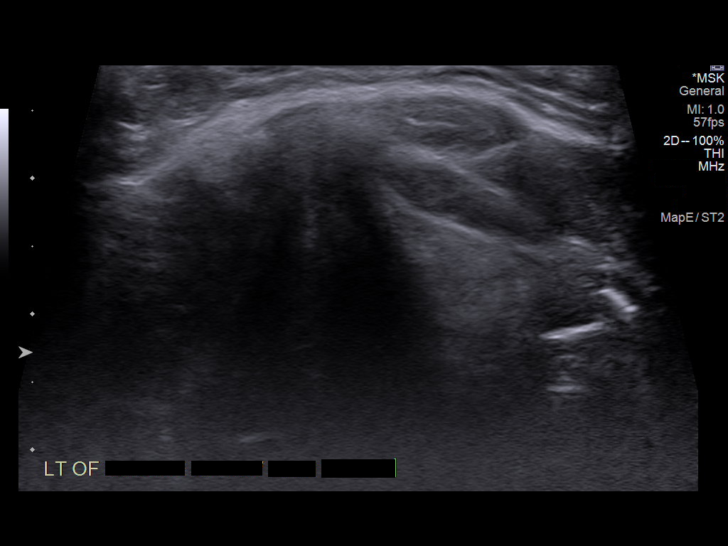

[14 of 21 positions shown; findings below may reference images not displayed]

FINDINGS: A focal lesion is present just below the mandible, to the left
midline, at consistent with the palpable lesion. Heterogeneous
echotexture noted. No internal color Doppler flow is present. Some
increased through transmission is present. Lesion measures 3.0 x
x 2.2 cm.
IMPRESSION: 1. Complex cystic lesion below the floor of the mouth measures 3.0 x
1.8 x 2.2 cm. Differential diagnosis includes a thyroglossal duct
cyst. Thyroid remnant is also considered. Lymph node is considered
less likely. Recommend CT of the neck with contrast or MRI of the
neck without and with contrast for further evaluation.

## 2024-02-22 ENCOUNTER — Other Ambulatory Visit: Payer: Self-pay | Admitting: Family Medicine

## 2024-02-22 DIAGNOSIS — F419 Anxiety disorder, unspecified: Secondary | ICD-10-CM

## 2024-02-22 NOTE — Telephone Encounter (Signed)
 Requested Prescriptions   Pending Prescriptions Disp Refills   ALPRAZolam  (XANAX ) 0.25 MG tablet [Pharmacy Med Name: ALPRAZOLAM  0.25 MG TABLET] 30 tablet     Sig: TAKE 1 TABLET BY MOUTH 2 TIMES DAILY AS NEEDED FOR ANXIETY.     Date of patient request: 02/22/2024 Last office visit: 06/10/2023 Upcoming visit: 06/10/2024 Date of last refill: 06/10/2023 Last refill amount: 30

## 2024-04-03 ENCOUNTER — Other Ambulatory Visit: Payer: Self-pay | Admitting: Family Medicine

## 2024-04-06 ENCOUNTER — Telehealth: Payer: Self-pay | Admitting: Sports Medicine

## 2024-04-06 NOTE — Telephone Encounter (Signed)
 Pt calling for refill of flexeril . Noted last OV 03/2023, pt happy to come in but Dr. Joane noted at that OV that he would refill this med for her. She had 6 refills left but it expired.  Based on current scheduling limitations, unsure if OV needed for refill.

## 2024-04-07 ENCOUNTER — Other Ambulatory Visit: Payer: Self-pay | Admitting: Sports Medicine

## 2024-04-07 MED ORDER — MELOXICAM 15 MG PO TABS: ORAL_TABLET | ORAL | 0 refills | Status: DC

## 2024-04-07 MED ORDER — CYCLOBENZAPRINE HCL 5 MG PO TABS: 5.0000 mg | ORAL_TABLET | Freq: Every evening | ORAL | 0 refills | Status: DC | PRN

## 2024-04-07 NOTE — Telephone Encounter (Signed)
 Pt notified of refill via phone.

## 2024-04-07 NOTE — Telephone Encounter (Signed)
 Flexeril  placed. Meloxicam  entered in error

## 2024-04-07 NOTE — Progress Notes (Signed)
 Refill placed

## 2024-04-07 NOTE — Progress Notes (Signed)
 Flexeril  placed. Meloxicam  entered in error

## 2024-04-24 ENCOUNTER — Other Ambulatory Visit: Payer: Self-pay | Admitting: Sports Medicine

## 2024-04-26 ENCOUNTER — Other Ambulatory Visit: Payer: Self-pay | Admitting: Sports Medicine

## 2024-05-03 ENCOUNTER — Telehealth: Payer: Self-pay | Admitting: Family Medicine

## 2024-05-03 NOTE — Telephone Encounter (Signed)
 Patient called requesting a refill on: cyclobenzaprine  (FLEXERIL ) 5 MG tablet    She asked if it could be sent as a 3 month supply because she will be losing her insurance soon.   Pharmacy: [facility] [address]      Last OV 03/27/23 - has not been seen in > 1 year  Forwarding to Dr. Joane

## 2024-05-03 NOTE — Telephone Encounter (Signed)
 Patient asked that this refill request be sent to Dr Joane. She said that it was filled by Dr Leonce last time due to Dr Joane being out of the office.

## 2024-05-03 NOTE — Telephone Encounter (Signed)
 Patient called requesting a refill on: cyclobenzaprine  (FLEXERIL ) 5 MG tablet   She asked if it could be sent as a 3 month supply because she will be losing her insurance soon.  Pharmacy: [facility] [address]

## 2024-05-05 MED ORDER — CYCLOBENZAPRINE HCL 5 MG PO TABS: 5.0000 mg | ORAL_TABLET | Freq: Every evening | ORAL | 3 refills | Status: AC | PRN

## 2024-05-05 NOTE — Telephone Encounter (Signed)
 Cyclobenzaprine  prescribed.  This is a generic medication and if you are pain out-of-pocket you can use GoodRx to find the cost of medications at different pharmacies as there sometimes is a big difference from 1 pharmacy to the next for the same generic medicine.

## 2024-06-10 ENCOUNTER — Encounter: Payer: Self-pay | Admitting: Family Medicine

## 2024-06-10 ENCOUNTER — Ambulatory Visit: Payer: BC Managed Care – PPO | Admitting: Family Medicine

## 2024-06-10 VITALS — BP 118/80 | HR 75 | Temp 98.4°F | Ht 68.5 in | Wt 158.0 lb

## 2024-06-10 DIAGNOSIS — E7800 Pure hypercholesterolemia, unspecified: Secondary | ICD-10-CM

## 2024-06-10 DIAGNOSIS — Z0000 Encounter for general adult medical examination without abnormal findings: Secondary | ICD-10-CM

## 2024-06-10 DIAGNOSIS — Z1231 Encounter for screening mammogram for malignant neoplasm of breast: Secondary | ICD-10-CM

## 2024-06-10 DIAGNOSIS — E559 Vitamin D deficiency, unspecified: Secondary | ICD-10-CM

## 2024-06-10 LAB — CBC WITH DIFFERENTIAL/PLATELET
Basophils Absolute: 0 K/uL (ref 0.0–0.1)
Basophils Relative: 0.5 % (ref 0.0–3.0)
Eosinophils Absolute: 0 K/uL (ref 0.0–0.7)
Eosinophils Relative: 0.3 % (ref 0.0–5.0)
HCT: 38.2 % (ref 36.0–46.0)
Hemoglobin: 12.7 g/dL (ref 12.0–15.0)
Lymphocytes Relative: 18.9 % (ref 12.0–46.0)
Lymphs Abs: 1.5 K/uL (ref 0.7–4.0)
MCHC: 33.1 g/dL (ref 30.0–36.0)
MCV: 93.1 fl (ref 78.0–100.0)
Monocytes Absolute: 0.7 K/uL (ref 0.1–1.0)
Monocytes Relative: 8.7 % (ref 3.0–12.0)
Neutro Abs: 5.5 K/uL (ref 1.4–7.7)
Neutrophils Relative %: 71.6 % (ref 43.0–77.0)
Platelets: 302 K/uL (ref 150.0–400.0)
RBC: 4.11 Mil/uL (ref 3.87–5.11)
RDW: 14.2 % (ref 11.5–15.5)
WBC: 7.7 K/uL (ref 4.0–10.5)

## 2024-06-10 LAB — HEPATIC FUNCTION PANEL
ALT: 17 U/L (ref 0–35)
AST: 20 U/L (ref 0–37)
Albumin: 5 g/dL (ref 3.5–5.2)
Alkaline Phosphatase: 47 U/L (ref 39–117)
Bilirubin, Direct: 0.1 mg/dL (ref 0.0–0.3)
Total Bilirubin: 0.6 mg/dL (ref 0.2–1.2)
Total Protein: 7.8 g/dL (ref 6.0–8.3)

## 2024-06-10 LAB — BASIC METABOLIC PANEL WITH GFR
BUN: 9 mg/dL (ref 6–23)
CO2: 28 meq/L (ref 19–32)
Calcium: 9.5 mg/dL (ref 8.4–10.5)
Chloride: 101 meq/L (ref 96–112)
Creatinine, Ser: 0.75 mg/dL (ref 0.40–1.20)
GFR: 99.45 mL/min (ref >60.00)
Glucose, Bld: 85 mg/dL (ref 70–99)
Potassium: 3.9 meq/L (ref 3.5–5.1)
Sodium: 139 meq/L (ref 135–145)

## 2024-06-10 LAB — LIPID PANEL
Cholesterol: 209 mg/dL — ABNORMAL HIGH (ref 0–200)
HDL: 92.2 mg/dL (ref >39.00)
LDL Cholesterol: 109 mg/dL — ABNORMAL HIGH (ref 0–99)
NonHDL: 117.15
Total CHOL/HDL Ratio: 2
Triglycerides: 43 mg/dL (ref 0.0–149.0)
VLDL: 8.6 mg/dL (ref 0.0–40.0)

## 2024-06-10 LAB — TSH: TSH: 1.22 u[IU]/mL (ref 0.35–5.50)

## 2024-06-10 LAB — VITAMIN D 25 HYDROXY (VIT D DEFICIENCY, FRACTURES): VITD: 32.74 ng/mL (ref 30.00–100.00)

## 2024-06-10 NOTE — Patient Instructions (Signed)
 Follow up in 1 year or as needed We'll notify you of your lab results and make any changes if needed Keep up the good work on healthy diet and regular exercise- you look great! We'll call you to schedule your mammogram Call with any questions or concerns Stay Safe!  Stay Healthy! Happy Fall!!

## 2024-06-10 NOTE — Progress Notes (Unsigned)
   Subjective:    Patient ID: Sherry Rodriguez, female    DOB: September 28, 1985, 38 y.o.   MRN: [id number]  HPI CPE- UTD on pap.  Due to start mammo  Health Maintenance  Topic Date Due   Hepatitis B Vaccines 19-59 Average Risk (1 of 3 - 19+ 3-dose series) Never done   HPV VACCINES (1 - 3-dose SCDM series) Never done   Mammogram  Never done   Influenza Vaccine  Never done   COVID-19 Vaccine (1 - 2025-26 season) Never done   DTaP/Tdap/Td (2 - Td or Tdap) 10/13/2026   Cervical Cancer Screening (HPV/Pap Cotest)  06/09/2028   Hepatitis C Screening  Completed   HIV Screening  Completed   Pneumococcal Vaccine  Aged Out   Meningococcal B Vaccine  Aged Out    Patient Care Team    Relationship Specialty Notifications Start End  Mahlon Comer BRAVO, MD PCP - General Family Medicine  10/13/16   [facility]    06/10/23      Review of Systems Patient reports no vision/ hearing changes, adenopathy,fever, weight change,  persistant/recurrent hoarseness , swallowing issues, chest pain, palpitations, edema, persistant/recurrent cough, hemoptysis, dyspnea (rest/exertional/paroxysmal nocturnal), gastrointestinal bleeding (melena, rectal bleeding), abdominal pain, significant heartburn, bowel changes, GU symptoms (dysuria, hematuria, incontinence), Gyn symptoms (abnormal  bleeding, pain),  syncope, focal weakness, memory loss, numbness & tingling, skin/hair/nail changes, abnormal bruising or bleeding, anxiety, or depression.     Objective:   Physical Exam General Appearance:    Alert, cooperative, no distress, appears stated age  Head:    Normocephalic, without obvious abnormality, atraumatic  Eyes:    PERRL, conjunctiva/corneas clear, EOM's intact both eyes  Ears:    Normal TM's and external ear canals, both ears  Nose:   Nares normal, septum midline, mucosa normal, no drainage    or sinus tenderness  Throat:   Lips, mucosa, and tongue normal; teeth and gums normal  Neck:   Supple, symmetrical,  trachea midline, no adenopathy;    Thyroid : no enlargement/tenderness/nodules  Back:     Symmetric, no curvature, ROM normal, no CVA tenderness  Lungs:     Clear to auscultation bilaterally, respirations unlabored  Chest Wall:    No tenderness or deformity   Heart:    Regular rate and rhythm, S1 and S2 normal, no murmur, rub   or gallop  Breast Exam:    Deferred to mammo  Abdomen:     Soft, non-tender, bowel sounds active all four quadrants,    no masses, no organomegaly  Genitalia:    Deferred  Rectal:    Extremities:   Extremities normal, atraumatic, no cyanosis or edema  Pulses:   2+ and symmetric all extremities  Skin:   Skin color, texture, turgor normal, no rashes or lesions  Lymph nodes:   Cervical, supraclavicular, and axillary nodes normal  Neurologic:   CNII-XII intact, normal strength, sensation and reflexes    throughout          Assessment & Plan:

## 2024-06-12 NOTE — Assessment & Plan Note (Signed)
 Pt's PE WNL.  UTD on pap.  Mammo ordered.  Check labs.  Anticipatory guidance provided.

## 2024-06-13 ENCOUNTER — Ambulatory Visit: Payer: Self-pay | Admitting: Family Medicine

## 2024-06-17 ENCOUNTER — Ambulatory Visit
Admission: RE | Admit: 2024-06-17 | Discharge: 2024-06-17 | Disposition: A | Discharge status: Home / Self Care | Source: Ambulatory Visit | Attending: Family Medicine | Admitting: Family Medicine

## 2024-06-17 DIAGNOSIS — Z1231 Encounter for screening mammogram for malignant neoplasm of breast: Secondary | ICD-10-CM | POA: Diagnosis not present

## 2024-09-05 ENCOUNTER — Encounter: Payer: Self-pay | Admitting: Family Medicine

## 2024-09-05 DIAGNOSIS — F419 Anxiety disorder, unspecified: Secondary | ICD-10-CM

## 2024-09-09 ENCOUNTER — Ambulatory Visit: Admitting: Family Medicine

## 2024-09-09 ENCOUNTER — Encounter: Payer: Self-pay | Admitting: Family Medicine

## 2024-09-09 VITALS — BP 110/62 | HR 73 | Temp 97.6°F | Ht 68.5 in | Wt 158.0 lb

## 2024-09-09 DIAGNOSIS — R197 Diarrhea, unspecified: Secondary | ICD-10-CM

## 2024-09-09 NOTE — Patient Instructions (Addendum)
 Follow up as needed or as scheduled Complete the stool test and return to the office Drink LOTS to replace any stool losses Call with any questions or concerns Hang in there!

## 2024-09-09 NOTE — Progress Notes (Signed)
" ° °  Subjective:    Patient ID: Sherry Rodriguez, female    DOB: [birth year], 39 y.o.   MRN: [id number]  HPI C diff exposure- husband developed C Diff after shoulder surgery.  Has has had 3 rounds of abx and still infected.  Pt herself had diarrhea x2 months this fall but thought it was due to stress.  Diarrhea has resolved but there is very distinct foul smell to stool.  Denies abd pain.   Review of Systems For ROS see HPI     Objective:   Physical Exam Vitals reviewed.  Constitutional:      General: She is not in acute distress.    Appearance: Normal appearance. She is not ill-appearing.  HENT:     Head: Normocephalic and atraumatic.  Eyes:     Extraocular Movements: Extraocular movements intact.     Conjunctiva/sclera: Conjunctivae normal.  Abdominal:     General: There is no distension.     Palpations: Abdomen is soft.     Tenderness: There is no abdominal tenderness. There is no guarding or rebound.  Skin:    General: Skin is warm and dry.     Coloration: Skin is not pale.  Neurological:     General: No focal deficit present.     Mental Status: She is alert and oriented to person, place, and time.  Psychiatric:        Mood and Affect: Mood normal.        Behavior: Behavior normal.        Thought Content: Thought content normal.           Assessment & Plan:  Diarrhea- new.  Husband has C Diff and is on 3rd round of tx.  Pt reports she also had diarrhea and while the stools have returned to normal consistency, they continue to have a foul smell.  She is concerned that she also has C Diff and she is just giving it back to her husband.  Will get stool test and tx prn.  Pt expressed understanding and is in agreement w/ plan.   "

## 2024-09-22 MED ORDER — ALPRAZOLAM 0.25 MG PO TABS: 0.2500 mg | ORAL_TABLET | Freq: Two times a day (BID) | ORAL | 1 refills | Status: AC | PRN

## 2024-09-22 NOTE — Telephone Encounter (Signed)
 Requested Prescriptions   Pending Prescriptions Disp Refills   ALPRAZolam  (XANAX ) 0.25 MG tablet 30 tablet 1    Sig: Take 1 tablet (0.25 mg total) by mouth 2 (two) times daily as needed for anxiety.     Date of patient request: 09/22/24 Last office visit: 06/10/2024 Upcoming visit: 09/09/2024 Date of last refill: 02/23/24 Last refill amount: 30 w/ 1 refill

## 2024-09-23 ENCOUNTER — Ambulatory Visit: Payer: Self-pay | Admitting: Family Medicine

## 2024-09-23 LAB — TIQ-MISC

## 2024-09-23 LAB — CLOSTRIDIUM DIFFICILE TOXIN B, QUALITATIVE, REAL-TIME PCR: Toxigenic C. Difficile by PCR: NOT DETECTED

## 2025-06-16 ENCOUNTER — Encounter: Admitting: Family Medicine
